# Patient Record
Sex: Male | Born: 1975 | Race: Black or African American | Hispanic: No | Marital: Single | Smoking: Current every day smoker
Health system: Southern US, Community
[De-identification: ages and names within clinical notes are randomized; demographics above are authoritative.]

## PROBLEM LIST (undated history)

## (undated) DIAGNOSIS — K852 Alcohol induced acute pancreatitis without necrosis or infection: Secondary | ICD-10-CM

## (undated) DIAGNOSIS — F1010 Alcohol abuse, uncomplicated: Secondary | ICD-10-CM

## (undated) DIAGNOSIS — Z21 Asymptomatic human immunodeficiency virus [HIV] infection status: Secondary | ICD-10-CM

## (undated) HISTORY — PX: ABDOMINAL SURGERY: SHX537

## (undated) HISTORY — DX: Asymptomatic human immunodeficiency virus (hiv) infection status: Z21

---

## 2015-04-29 ENCOUNTER — Encounter (HOSPITAL_COMMUNITY): Payer: Self-pay | Admitting: Emergency Medicine

## 2015-04-29 ENCOUNTER — Emergency Department (HOSPITAL_COMMUNITY)
Admission: EM | Admit: 2015-04-29 | Discharge: 2015-04-29 | Disposition: A | Discharge status: Home / Self Care | Payer: Self-pay | Attending: Emergency Medicine | Admitting: Emergency Medicine

## 2015-04-29 ENCOUNTER — Emergency Department (HOSPITAL_COMMUNITY): Payer: Self-pay

## 2015-04-29 DIAGNOSIS — Z720 Tobacco use: Secondary | ICD-10-CM | POA: Insufficient documentation

## 2015-04-29 DIAGNOSIS — M545 Low back pain, unspecified: Secondary | ICD-10-CM

## 2015-04-29 DIAGNOSIS — K5900 Constipation, unspecified: Secondary | ICD-10-CM

## 2015-04-29 MED ORDER — DICYCLOMINE HCL 20 MG PO TABS: 20.0000 mg | ORAL_TABLET | Freq: Two times a day (BID) | ORAL | Status: DC

## 2015-04-29 MED ORDER — NAPROXEN 375 MG PO TABS
375.0000 mg | ORAL_TABLET | Freq: Two times a day (BID) | ORAL | Status: DC
Start: 2015-04-29 — End: 2018-04-05

## 2015-04-29 NOTE — ED Notes (Signed)
PT ambulated with baseline gait; VSS; A&Ox3; no signs of distress; respirations even and unlabored; skin warm and dry; no questions upon discharge.  

## 2015-04-29 NOTE — Discharge Instructions (Signed)
Constipation Constipation is when a person has fewer than three bowel movements a week, has difficulty having a bowel movement, or has stools that are dry, hard, or larger than normal. As people grow older, constipation is more common. If you try to fix constipation with medicines that make you have a bowel movement (laxatives), the problem may get worse. Long-term laxative use may cause the muscles of the colon to become weak. A low-fiber diet, not taking in enough fluids, and taking certain medicines may make constipation worse.  CAUSES   Certain medicines, such as antidepressants, pain medicine, iron supplements, antacids, and water pills.   Certain diseases, such as diabetes, irritable bowel syndrome (IBS), thyroid disease, or depression.   Not drinking enough water.   Not eating enough fiber-rich foods.   Stress or travel.   Lack of physical activity or exercise.   Ignoring the urge to have a bowel movement.   Using laxatives too much.  SIGNS AND SYMPTOMS   Having fewer than three bowel movements a week.   Straining to have a bowel movement.   Having stools that are hard, dry, or larger than normal.   Feeling full or bloated.   Pain in the lower abdomen.   Not feeling relief after having a bowel movement.  DIAGNOSIS  Your health care provider will take a medical history and perform a physical exam. Further testing may be done for severe constipation. Some tests may include:  A barium enema X-ray to examine your rectum, colon, and, sometimes, your small intestine.   A sigmoidoscopy to examine your lower colon.   A colonoscopy to examine your entire colon. TREATMENT  Treatment will depend on the severity of your constipation and what is causing it. Some dietary treatments include drinking more fluids and eating more fiber-rich foods. Lifestyle treatments may include regular exercise. If these diet and lifestyle recommendations do not help, your health care  provider may recommend taking over-the-counter laxative medicines to help you have bowel movements. Prescription medicines may be prescribed if over-the-counter medicines do not work.  HOME CARE INSTRUCTIONS   Eat foods that have a lot of fiber, such as fruits, vegetables, whole grains, and beans.  Limit foods high in fat and processed sugars, such as french fries, hamburgers, cookies, candies, and soda.   A fiber supplement may be added to your diet if you cannot get enough fiber from foods.   Drink enough fluids to keep your urine clear or pale yellow.   Exercise regularly or as directed by your health care provider.   Go to the restroom when you have the urge to go. Do not hold it.   Only take over-the-counter or prescription medicines as directed by your health care provider. Do not take other medicines for constipation without talking to your health care provider first.  [place] IF:   You have bright red blood in your stool.   Your constipation lasts for more than 4 days or gets worse.   You have abdominal or rectal pain.   You have thin, pencil-like stools.   You have unexplained weight loss. MAKE SURE YOU:   Understand these instructions.  Will watch your condition.  Will get help right away if you are not doing well or get worse. Document Released: 04/16/2004 Document Revised: 07/24/2013 Document Reviewed: 04/30/2013 [facility] Patient Information 2015 [employer], [place]. This information is not intended to replace advice given to you by your health care provider. Make sure you discuss any questions  you have with your health care provider.  Diet and Irritable Bowel Syndrome  No cure has been found for irritable bowel syndrome (IBS). Many options are available to treat the symptoms. Your caregiver will give you the best treatments available for your symptoms. He or she will also encourage you to manage stress and to make changes to your diet. You  need to work with your caregiver and Registered Dietician to find the best combination of medicine, diet, counseling, and support to control your symptoms. The following are some diet suggestions. FOODS THAT MAKE IBS WORSE  Fatty foods, such as [place] fries.  Milk products, such as cheese or ice cream.  Chocolate.  Alcohol.  Caffeine (found in coffee and some sodas).  Carbonated drinks, such as soda. If certain foods cause symptoms, you should eat less of them or stop eating them. FOOD JOURNAL   Keep a journal of the foods that seem to cause distress. Write down:  What you are eating during the day and when.  What problems you are having after eating.  When the symptoms occur in relation to your meals.  What foods always make you feel badly.  Take your notes with you to your caregiver to see if you should stop eating certain foods. FOODS THAT MAKE IBS BETTER Fiber reduces IBS symptoms, especially constipation, because it makes stools soft, bulky, and easier to pass. Fiber is found in bran, bread, cereal, beans, fruit, and vegetables. Examples of foods with fiber include:  Apples.  Peaches.  Pears.  Berries.  Figs.  Broccoli, raw.  Cabbage.  Carrots.  Raw peas.  Kidney beans.  Lima beans.  Whole-grain bread.  Whole-grain cereal. Add foods with fiber to your diet a little at a time. This will let your body get used to them. Too much fiber at once might cause gas and swelling of your abdomen. This can trigger symptoms in a person with IBS. Caregivers usually recommend a diet with enough fiber to produce soft, painless bowel movements. High fiber diets may cause gas and bloating. However, these symptoms often go away within a few weeks, as your body adjusts. In many cases, dietary fiber may lessen IBS symptoms, particularly constipation. However, it may not help pain or diarrhea. High fiber diets keep the colon mildly enlarged (distended) with the added fiber.  This may help prevent spasms in the colon. Some forms of fiber also keep water in the stool, thereby preventing hard stools that are difficult to pass.  Besides telling you to eat more foods with fiber, your caregiver may also tell you to get more fiber by taking a fiber pill or drinking water mixed with a special high fiber powder. An example of this is a natural fiber laxative containing psyllium seed.  TIPS  Large meals can cause cramping and diarrhea in people with IBS. If this happens to you, try eating 4 or 5 small meals a day, or try eating less at each of your usual 3 meals. It may also help if your meals are low in fat and high in carbohydrates. Examples of carbohydrates are pasta, rice, whole-grain breads and cereals, fruits, and vegetables.  If dairy products cause your symptoms to flare up, you can try eating less of those foods. You might be able to handle yogurt better than other dairy products, because it contains bacteria that helps with digestion. Dairy products are an important source of calcium and other nutrients. If you need to avoid dairy products, be sure to  talk with a Registered Dietitian about getting these nutrients through other food sources.  Drink enough water and fluids to keep your urine clear or pale yellow. This is important, especially if you have diarrhea. FOR MORE INFORMATION  International Foundation for Functional Gastrointestinal Disorders: www.iffgd.org  National Digestive Diseases Information Clearinghouse: digestive.StageSync.si Document Released: 10/09/2003 Document Revised: 10/11/2011 Document Reviewed: 10/19/2013 [facility] Patient Information 2015 [place], [place]. This information is not intended to replace advice given to you by your health care provider. Make sure you discuss any questions you have with your health care provider.  Back Exercises Back exercises help treat and prevent back injuries. The goal of back exercises is to increase the strength  of your abdominal and back muscles and the flexibility of your back. These exercises should be started when you no longer have back pain. Back exercises include:  Pelvic Tilt. Lie on your back with your knees bent. Tilt your pelvis until the lower part of your back is against the floor. Hold this position 5 to 10 sec and repeat 5 to 10 times.  Knee to Chest. Pull first 1 knee up against your chest and hold for 20 to 30 seconds, repeat this with the other knee, and then both knees. This may be done with the other leg straight or bent, whichever feels better.  Sit-Ups or Curl-Ups. Bend your knees 90 degrees. Start with tilting your pelvis, and do a partial, slow sit-up, lifting your trunk only 30 to 45 degrees off the floor. Take at least 2 to 3 seconds for each sit-up. Do not do sit-ups with your knees out straight. If partial sit-ups are difficult, simply do the above but with only tightening your abdominal muscles and holding it as directed.  Hip-Lift. Lie on your back with your knees flexed 90 degrees. Push down with your feet and shoulders as you raise your hips a couple inches off the floor; hold for 10 seconds, repeat 5 to 10 times.  Back arches. Lie on your stomach, propping yourself up on bent elbows. Slowly press on your hands, causing an arch in your low back. Repeat 3 to 5 times. Any initial stiffness and discomfort should lessen with repetition over time.  Shoulder-Lifts. Lie face down with arms beside your body. Keep hips and torso pressed to floor as you slowly lift your head and shoulders off the floor. Do not overdo your exercises, especially in the beginning. Exercises may cause you some mild back discomfort which lasts for a few minutes; however, if the pain is more severe, or lasts for more than 15 minutes, do not continue exercises until you see your caregiver. Improvement with exercise therapy for back problems is slow.  See your caregivers for assistance with developing a proper  back exercise program. Document Released: 08/26/2004 Document Revised: 10/11/2011 Document Reviewed: 05/20/2011 [facility] Patient Information 2015 [place], [place]. This information is not intended to replace advice given to you by your health care provider. Make sure you discuss any questions you have with your health care provider. SEEK IMMEDIATE MEDICAL ATTENTION IF: New numbness, tingling, weakness, or problem with the use of your arms or legs.  Severe back pain not relieved with medications.  Change in bowel or bladder control.  Increasing pain in any areas of the body (such as chest or abdominal pain).  Shortness of breath, dizziness or fainting.  Nausea (feeling sick to your stomach), vomiting, fever, or sweats. Abdominal (belly) pain can be caused by many things. Your caregiver performed an examination  and possibly ordered blood/urine tests and imaging (CT scan, x-rays, ultrasound). Many cases can be observed and treated at home after initial evaluation in the emergency department. Even though you are being discharged home, abdominal pain can be unpredictable. Therefore, you need a repeated exam if your pain does not resolve, returns, or worsens. Most patients with abdominal pain don't have to be admitted to the hospital or have surgery, but serious problems like appendicitis and gallbladder attacks can start out as nonspecific pain. Many abdominal conditions cannot be diagnosed in one visit, so follow-up evaluations are very important. SEEK IMMEDIATE MEDICAL ATTENTION IF: The pain does not go away or becomes severe.  A temperature above 101 develops.  Repeated vomiting occurs (multiple episodes).  The pain becomes localized to portions of the abdomen. The right side could possibly be appendicitis. In an adult, the left lower portion of the abdomen could be colitis or diverticulitis.  Blood is being passed in stools or vomit (bright red or black tarry stools).  Return also if you develop chest  pain, difficulty breathing, dizziness or fainting, or become confused, poorly responsive, or inconsolable (young children).

## 2015-04-29 NOTE — ED Notes (Signed)
Pt sts abd pain from constipation; pt sts last BM was yesterday but was small; pt denies N/V
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
## 2015-11-19 ENCOUNTER — Emergency Department (HOSPITAL_COMMUNITY)
Admission: EM | Admit: 2015-11-19 | Discharge: 2015-11-19 | Disposition: A | Discharge status: Home / Self Care | Payer: Self-pay | Attending: Emergency Medicine | Admitting: Emergency Medicine

## 2015-11-19 ENCOUNTER — Encounter (HOSPITAL_COMMUNITY): Payer: Self-pay | Admitting: Emergency Medicine

## 2015-11-19 ENCOUNTER — Emergency Department (HOSPITAL_COMMUNITY): Payer: Self-pay

## 2015-11-19 DIAGNOSIS — Z791 Long term (current) use of non-steroidal anti-inflammatories (NSAID): Secondary | ICD-10-CM | POA: Insufficient documentation

## 2015-11-19 DIAGNOSIS — Z79899 Other long term (current) drug therapy: Secondary | ICD-10-CM | POA: Insufficient documentation

## 2015-11-19 DIAGNOSIS — F17200 Nicotine dependence, unspecified, uncomplicated: Secondary | ICD-10-CM | POA: Insufficient documentation

## 2015-11-19 DIAGNOSIS — R1084 Generalized abdominal pain: Secondary | ICD-10-CM | POA: Insufficient documentation

## 2015-11-19 LAB — LIPASE, BLOOD: LIPASE: 69 U/L — AB (ref 11–51)

## 2015-11-19 LAB — URINALYSIS, ROUTINE W REFLEX MICROSCOPIC
Bilirubin Urine: NEGATIVE
GLUCOSE, UA: NEGATIVE mg/dL
HGB URINE DIPSTICK: NEGATIVE
Ketones, ur: 15 mg/dL — AB
Nitrite: NEGATIVE
Protein, ur: NEGATIVE mg/dL
Specific Gravity, Urine: 1.028 (ref 1.005–1.030)
pH: 5.5 (ref 5.0–8.0)

## 2015-11-19 LAB — COMPREHENSIVE METABOLIC PANEL
ALT: 16 U/L — AB (ref 17–63)
AST: 18 U/L (ref 15–41)
Albumin: 3.5 g/dL (ref 3.5–5.0)
Alkaline Phosphatase: 66 U/L (ref 38–126)
Anion gap: 7 (ref 5–15)
BUN: 6 mg/dL (ref 6–20)
CHLORIDE: 106 mmol/L (ref 101–111)
CO2: 25 mmol/L (ref 22–32)
Calcium: 9.3 mg/dL (ref 8.9–10.3)
Creatinine, Ser: 1.24 mg/dL (ref 0.61–1.24)
Glucose, Bld: 111 mg/dL — ABNORMAL HIGH (ref 65–99)
POTASSIUM: 4 mmol/L (ref 3.5–5.1)
SODIUM: 138 mmol/L (ref 135–145)
Total Bilirubin: 0.6 mg/dL (ref 0.3–1.2)
Total Protein: 7.2 g/dL (ref 6.5–8.1)

## 2015-11-19 LAB — CBC
HEMATOCRIT: 46.1 % (ref 39.0–52.0)
Hemoglobin: 15.8 g/dL (ref 13.0–17.0)
MCH: 30.6 pg (ref 26.0–34.0)
MCHC: 34.3 g/dL (ref 30.0–36.0)
MCV: 89.3 fL (ref 78.0–100.0)
Platelets: 237 10*3/uL (ref 150–400)
RBC: 5.16 MIL/uL (ref 4.22–5.81)
RDW: 13.2 % (ref 11.5–15.5)
WBC: 8.4 10*3/uL (ref 4.0–10.5)

## 2015-11-19 LAB — URINE MICROSCOPIC-ADD ON: RBC / HPF: NONE SEEN RBC/hpf (ref 0–5)

## 2015-11-19 MED ORDER — GI COCKTAIL ~~LOC~~
30.0000 mL | Freq: Once | ORAL | Status: AC
  Administered 2015-11-19: 30 mL via ORAL
  Filled 2015-11-19: qty 30

## 2015-11-19 MED ORDER — DICYCLOMINE HCL 20 MG PO TABS: 20.0000 mg | ORAL_TABLET | Freq: Two times a day (BID) | ORAL | Status: DC

## 2015-11-19 NOTE — ED Notes (Signed)
Patient transported to X-ray 

## 2015-11-19 NOTE — ED Notes (Signed)
Pt sts mid abd pain with cramping x 2 days

## 2015-11-19 NOTE — ED Notes (Signed)
Pt is in stable condition upon d/c and ambulates from ED. 

## 2015-11-19 NOTE — Discharge Instructions (Signed)
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
## 2015-11-19 NOTE — ED Notes (Addendum)
Pt states he ate Jamia[place] food 2 days ago and has abdominal pain since. Pt is eating very quickly and drooling while he is stating his pain is a 10/10. Greta DoomBowie, PA informed.

## 2017-06-21 ENCOUNTER — Other Ambulatory Visit: Payer: Self-pay

## 2017-06-21 ENCOUNTER — Emergency Department (HOSPITAL_COMMUNITY)
Admission: EM | Admit: 2017-06-21 | Discharge: 2017-06-21 | Disposition: A | Discharge status: Home / Self Care | Payer: Self-pay | Attending: Emergency Medicine | Admitting: Emergency Medicine

## 2017-06-21 ENCOUNTER — Encounter (HOSPITAL_COMMUNITY): Payer: Self-pay | Admitting: *Deleted

## 2017-06-21 DIAGNOSIS — K852 Alcohol induced acute pancreatitis without necrosis or infection: Secondary | ICD-10-CM | POA: Insufficient documentation

## 2017-06-21 DIAGNOSIS — F17210 Nicotine dependence, cigarettes, uncomplicated: Secondary | ICD-10-CM | POA: Insufficient documentation

## 2017-06-21 HISTORY — DX: Alcohol abuse, uncomplicated: F10.10

## 2017-06-21 LAB — COMPREHENSIVE METABOLIC PANEL
ALBUMIN: 3.8 g/dL (ref 3.5–5.0)
ALT: 18 U/L (ref 17–63)
AST: 28 U/L (ref 15–41)
Alkaline Phosphatase: 83 U/L (ref 38–126)
Anion gap: 4 — ABNORMAL LOW (ref 5–15)
BUN: 13 mg/dL (ref 6–20)
CHLORIDE: 101 mmol/L (ref 101–111)
CO2: 29 mmol/L (ref 22–32)
CREATININE: 1.38 mg/dL — AB (ref 0.61–1.24)
Calcium: 9.2 mg/dL (ref 8.9–10.3)
GFR calc Af Amer: >60 mL/min (ref >60)
GLUCOSE: 89 mg/dL (ref 65–99)
POTASSIUM: 4.3 mmol/L (ref 3.5–5.1)
Sodium: 134 mmol/L — ABNORMAL LOW (ref 135–145)
Total Bilirubin: 0.5 mg/dL (ref 0.3–1.2)
Total Protein: 7.4 g/dL (ref 6.5–8.1)

## 2017-06-21 LAB — URINALYSIS, ROUTINE W REFLEX MICROSCOPIC
BILIRUBIN URINE: NEGATIVE
Glucose, UA: NEGATIVE mg/dL
Ketones, ur: NEGATIVE mg/dL
Nitrite: NEGATIVE
PH: 5 (ref 5.0–8.0)
Protein, ur: NEGATIVE mg/dL
SPECIFIC GRAVITY, URINE: 1.024 (ref 1.005–1.030)
Squamous Epithelial / LPF: NONE SEEN

## 2017-06-21 LAB — CBC
HEMATOCRIT: 46 % (ref 39.0–52.0)
Hemoglobin: 15.8 g/dL (ref 13.0–17.0)
MCH: 30.2 pg (ref 26.0–34.0)
MCHC: 34.3 g/dL (ref 30.0–36.0)
MCV: 88 fL (ref 78.0–100.0)
PLATELETS: 221 10*3/uL (ref 150–400)
RBC: 5.23 MIL/uL (ref 4.22–5.81)
RDW: 13.5 % (ref 11.5–15.5)
WBC: 8.9 10*3/uL (ref 4.0–10.5)

## 2017-06-21 LAB — LIPASE, BLOOD: LIPASE: 61 U/L — AB (ref 11–51)

## 2017-06-21 NOTE — ED Notes (Signed)
Called pt to be triaged no answer
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
## 2017-06-21 NOTE — ED Notes (Signed)
Pt refused to sign.  

## 2017-06-21 NOTE — ED Notes (Signed)
Per PT " I have been stopped up for 2 days, I am usually very regular." Pt also says he is an everyday drinker 1-2 beers and is trying to quit. Pt states he has not tried any meds for constipation but knows they will not work for him.

## 2017-06-21 NOTE — ED Provider Notes (Signed)
[facility]   CSN: [id number] Arrival date & time: 06/21/17  [id number]     History   Chief Complaint Chief Complaint  Patient presents with  . Abdominal Pain  . Constipation    HPI Phillip Barnett is a 41 y.o. male.  HPI  41 y.o. male with a hx of ETOH abuse, presents to the Emergency Department today due to abdominal pain. Notes epigastric in origin. Notes intermittent pain. Rates 7/10. Minimal pain currently. Able to tolerate PO. No N/V/D. No meds PTA. Pt notes decrease in BM for 2 days. Pt passing flatus. No hx SBO. Able to tolerate PO. Pt notes that he used to be an alcoholic and was sober "for a while" and started to drink again on Saturday. Notes pain started around then. No other symptoms noted   Past Medical History:  Diagnosis Date  . Alcohol abuse     There are no active problems to display for this patient.   Past Surgical History:  Procedure Laterality Date  . ABDOMINAL SURGERY         Home Medications    Prior to Admission medications   Medication Sig Start Date End Date Taking? Authorizing Provider  dicyclomine (BENTYL) 20 MG tablet Take 1 tablet (20 mg total) by mouth 2 (two) times daily. 11/19/15   Fayrene Helperran, Bowie, PA-C  naproxen (NAPROSYN) 375 MG tablet Take 1 tablet (375 mg total) by mouth 2 (two) times daily. 04/29/15   Arthor CaptainHarris, Abigail, PA-C    Family History No family history on file.  Social History Social History   Tobacco Use  . Smoking status: Current Every Day Smoker  . Smokeless tobacco: Never Used  Substance Use Topics  . Alcohol use: Yes    Comment: Quit 06/18/2017  . Drug use: No     Allergies   Sulfa antibiotics   Review of Systems Review of Systems ROS reviewed and all are negative for acute change except as noted in the HPI.  Physical Exam Updated Vital Signs BP 115/66 (BP Location: Right Arm)   Pulse (!) 56   Temp 97.9 F (36.6 C) (Oral)   Resp 16   SpO2 100%    Physical Exam  Constitutional: He is oriented to person, place, and time. Vital signs are normal. He appears well-developed and well-nourished. No distress.  HENT:  Head: Normocephalic and atraumatic.  Right Ear: Hearing, tympanic membrane, external ear and ear canal normal.  Left Ear: Hearing, tympanic membrane, external ear and ear canal normal.  Nose: Nose normal.  Mouth/Throat: Uvula is midline, oropharynx is clear and moist and mucous membranes are normal. No trismus in the jaw. No oropharyngeal exudate, posterior oropharyngeal erythema or tonsillar abscesses.  Eyes: Conjunctivae and EOM are normal. Pupils are equal, round, and reactive to light.  Neck: Normal range of motion. Neck supple. No tracheal deviation present.  Cardiovascular: Normal rate, regular rhythm, S1 normal, S2 normal, normal heart sounds, intact distal pulses and normal pulses.  Pulmonary/Chest: Effort normal and breath sounds normal. No respiratory distress. He has no decreased breath sounds. He has no wheezes. He has no rhonchi. He has no rales.  Abdominal: Normal appearance and bowel sounds are normal. There is no tenderness. There is no rigidity, no rebound, no guarding, no CVA tenderness, no tenderness at McBurney's point and negative Murphy's sign.  Abdomen soft  Musculoskeletal: Normal range of motion.  Neurological: He is alert and oriented to person, place, and time.  Skin: Skin  is warm and dry.  Psychiatric: He has a normal mood and affect. His speech is normal and behavior is normal. Thought content normal.  Nursing note and vitals reviewed.    ED Treatments / Results  Labs (all labs ordered are listed, but only abnormal results are displayed) Labs Reviewed  LIPASE, BLOOD - Abnormal; Notable for the following components:      Result Value   Lipase 61 (*)    All other components within normal limits  COMPREHENSIVE METABOLIC PANEL - Abnormal; Notable for the following components:   Sodium 134 (*)     Creatinine, Ser 1.38 (*)    Anion gap 4 (*)    All other components within normal limits  URINALYSIS, ROUTINE W REFLEX MICROSCOPIC - Abnormal; Notable for the following components:   Hgb urine dipstick MODERATE (*)    Leukocytes, UA SMALL (*)    Bacteria, UA RARE (*)    All other components within normal limits  CBC    EKG  EKG Interpretation None       Radiology No results found.  Procedures Procedures (including critical care time)  Medications Ordered in ED Medications - No data to display   Initial Impression / Assessment and Plan / ED Course  I have reviewed the triage vital signs and the nursing notes.  Pertinent labs & imaging results that were available during my care of the patient were reviewed by me and considered in my medical decision making (see chart for details).  Final Clinical Impressions(s) / ED Diagnoses  {I have reviewed and evaluated the relevant laboratory values.   {I have reviewed the relevant previous healthcare records.  {I obtained HPI from historian.   ED Course:  Assessment: Patient is a 41 y.o. male with a hx of ETOH abuse, presents to the Emergency Department today due to abdominal pain. Notes epigastric in origin. Notes intermittent pain. Rates 7/10. Minimal pain currently. Able to tolerate PO. No N/V/D. No meds PTA. Pt notes decrease in BM for 2 days. Pt passing flatus. No hx SBO. Able to tolerate PO. Pt notes that he used to be an alcoholic and was sober "for a while" and started to drink again on Saturday. Notes pain started around then. On exam, nontoxic, nonseptic appearing, in no apparent distress. Patient's pain and other symptoms adequately managed in emergency department. Labs and vitals reviewed.  Patient does not meet the SIRS or Sepsis criteria.  On repeat exam patient does not have a surgical abdomen and there are no peritoneal signs.  No indication of appendicitis, bowel obstruction, bowel perforation, cholecystitis,  diverticulitis. Suspect mild pancreatitis brought on by ETOH. Notes hx same. Patient discharged home with symptomatic treatment and given strict instructions for follow-up with their primary care physician.  I have also discussed reasons to return immediately to the ER.  Patient expresses understanding and agrees with plan  Disposition/Plan:  DC Home Additional Verbal discharge instructions given and discussed with patient.  Pt Instructed to f/u with PCP in the next week for evaluation and treatment of symptoms. Return precautions given Pt acknowledges and agrees with plan  Supervising Physician Mabe, Latanya MaudlinMartha L, MD  Final diagnoses:  Alcohol-induced acute pancreatitis, unspecified complication status    ED Discharge Orders    None       Audry PiliMohr, Viviane Semidey, PA-C 06/21/17 1331    Mabe, Latanya MaudlinMartha L, MD 06/21/17 1355

## 2017-06-21 NOTE — ED Triage Notes (Signed)
Pt states here with abdominal pain and not using bathroom normally - states constipation.  States when he eats he gets sick.

## 2017-06-21 NOTE — ED Notes (Signed)
Pt verbalized understanding discharge instructions and denies any further needs or questions at this time. VS stable, ambulatory and steady gait.   

## 2017-10-25 ENCOUNTER — Emergency Department (HOSPITAL_COMMUNITY)
Admission: EM | Admit: 2017-10-25 | Discharge: 2017-10-25 | Disposition: A | Discharge status: Home / Self Care | Payer: Self-pay | Attending: Emergency Medicine | Admitting: Emergency Medicine

## 2017-10-25 ENCOUNTER — Encounter (HOSPITAL_COMMUNITY): Payer: Self-pay | Admitting: *Deleted

## 2017-10-25 ENCOUNTER — Other Ambulatory Visit: Payer: Self-pay

## 2017-10-25 DIAGNOSIS — Z5321 Procedure and treatment not carried out due to patient leaving prior to being seen by health care provider: Secondary | ICD-10-CM | POA: Insufficient documentation

## 2017-10-25 DIAGNOSIS — R109 Unspecified abdominal pain: Secondary | ICD-10-CM | POA: Insufficient documentation

## 2017-10-25 LAB — COMPREHENSIVE METABOLIC PANEL
ALK PHOS: 68 U/L (ref 38–126)
ALT: 21 U/L (ref 17–63)
AST: 18 U/L (ref 15–41)
Albumin: 3.5 g/dL (ref 3.5–5.0)
Anion gap: 7 (ref 5–15)
BILIRUBIN TOTAL: 0.6 mg/dL (ref 0.3–1.2)
CO2: 27 mmol/L (ref 22–32)
Calcium: 9.2 mg/dL (ref 8.9–10.3)
Chloride: 105 mmol/L (ref 101–111)
Creatinine, Ser: 1.03 mg/dL (ref 0.61–1.24)
GFR calc Af Amer: >60 mL/min (ref >60)
GFR calc non Af Amer: >60 mL/min (ref >60)
GLUCOSE: 123 mg/dL — AB (ref 65–99)
Potassium: 4.3 mmol/L (ref 3.5–5.1)
Sodium: 139 mmol/L (ref 135–145)
TOTAL PROTEIN: 6.9 g/dL (ref 6.5–8.1)

## 2017-10-25 LAB — CBC
HEMATOCRIT: 44.1 % (ref 39.0–52.0)
Hemoglobin: 14.2 g/dL (ref 13.0–17.0)
MCH: 28.8 pg (ref 26.0–34.0)
MCHC: 32.2 g/dL (ref 30.0–36.0)
MCV: 89.5 fL (ref 78.0–100.0)
Platelets: 228 10*3/uL (ref 150–400)
RBC: 4.93 MIL/uL (ref 4.22–5.81)
RDW: 14.2 % (ref 11.5–15.5)
WBC: 5.7 10*3/uL (ref 4.0–10.5)

## 2017-10-25 LAB — URINALYSIS, ROUTINE W REFLEX MICROSCOPIC
Bilirubin Urine: NEGATIVE
Glucose, UA: NEGATIVE mg/dL
HGB URINE DIPSTICK: NEGATIVE
Ketones, ur: NEGATIVE mg/dL
LEUKOCYTES UA: NEGATIVE
Nitrite: NEGATIVE
Protein, ur: NEGATIVE mg/dL
SPECIFIC GRAVITY, URINE: 1.015 (ref 1.005–1.030)
pH: 7 (ref 5.0–8.0)

## 2017-10-25 LAB — LIPASE, BLOOD: Lipase: 74 U/L — ABNORMAL HIGH (ref 11–51)

## 2017-10-25 NOTE — ED Notes (Signed)
No response when called to be roomed x 2
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
## 2018-02-01 ENCOUNTER — Emergency Department (HOSPITAL_COMMUNITY)
Admission: EM | Admit: 2018-02-01 | Discharge: 2018-02-01 | Disposition: A | Discharge status: Home / Self Care | Payer: Self-pay | Attending: Emergency Medicine | Admitting: Emergency Medicine

## 2018-02-01 ENCOUNTER — Emergency Department (HOSPITAL_COMMUNITY): Payer: Self-pay

## 2018-02-01 ENCOUNTER — Encounter (HOSPITAL_COMMUNITY): Payer: Self-pay | Admitting: Emergency Medicine

## 2018-02-01 DIAGNOSIS — K2920 Alcoholic gastritis without bleeding: Secondary | ICD-10-CM | POA: Insufficient documentation

## 2018-02-01 DIAGNOSIS — F17200 Nicotine dependence, unspecified, uncomplicated: Secondary | ICD-10-CM | POA: Insufficient documentation

## 2018-02-01 DIAGNOSIS — Z79899 Other long term (current) drug therapy: Secondary | ICD-10-CM | POA: Insufficient documentation

## 2018-02-01 HISTORY — DX: Alcohol induced acute pancreatitis without necrosis or infection: K85.20

## 2018-02-01 LAB — COMPREHENSIVE METABOLIC PANEL
ALBUMIN: 4.1 g/dL (ref 3.5–5.0)
ALT: 17 U/L (ref 0–44)
AST: 21 U/L (ref 15–41)
Alkaline Phosphatase: 72 U/L (ref 38–126)
Anion gap: 9 (ref 5–15)
BUN: 9 mg/dL (ref 6–20)
CHLORIDE: 104 mmol/L (ref 98–111)
CO2: 25 mmol/L (ref 22–32)
Calcium: 9.9 mg/dL (ref 8.9–10.3)
Creatinine, Ser: 1.47 mg/dL — ABNORMAL HIGH (ref 0.61–1.24)
GFR calc Af Amer: >60 mL/min (ref >60)
GFR calc non Af Amer: 58 mL/min — ABNORMAL LOW (ref >60)
GLUCOSE: 123 mg/dL — AB (ref 70–99)
POTASSIUM: 4 mmol/L (ref 3.5–5.1)
Sodium: 138 mmol/L (ref 135–145)
Total Bilirubin: 1.2 mg/dL (ref 0.3–1.2)
Total Protein: 8.2 g/dL — ABNORMAL HIGH (ref 6.5–8.1)

## 2018-02-01 LAB — CBC
HEMATOCRIT: 51.5 % (ref 39.0–52.0)
Hemoglobin: 17.2 g/dL — ABNORMAL HIGH (ref 13.0–17.0)
MCH: 29.6 pg (ref 26.0–34.0)
MCHC: 33.4 g/dL (ref 30.0–36.0)
MCV: 88.5 fL (ref 78.0–100.0)
Platelets: 251 10*3/uL (ref 150–400)
RBC: 5.82 MIL/uL — ABNORMAL HIGH (ref 4.22–5.81)
RDW: 13.9 % (ref 11.5–15.5)
WBC: 11.8 10*3/uL — ABNORMAL HIGH (ref 4.0–10.5)

## 2018-02-01 LAB — URINALYSIS, ROUTINE W REFLEX MICROSCOPIC
Bilirubin Urine: NEGATIVE
Glucose, UA: NEGATIVE mg/dL
Hgb urine dipstick: NEGATIVE
KETONES UR: NEGATIVE mg/dL
LEUKOCYTES UA: NEGATIVE
NITRITE: NEGATIVE
PH: 5 (ref 5.0–8.0)
Protein, ur: NEGATIVE mg/dL
SPECIFIC GRAVITY, URINE: 1.026 (ref 1.005–1.030)

## 2018-02-01 LAB — LIPASE, BLOOD: LIPASE: 40 U/L (ref 11–51)

## 2018-02-01 MED ORDER — OMEPRAZOLE 20 MG PO CPDR: 20.0000 mg | DELAYED_RELEASE_CAPSULE | Freq: Every day | ORAL | 0 refills | Status: DC

## 2018-02-01 MED ORDER — SUCRALFATE 1 G PO TABS: 1.0000 g | ORAL_TABLET | Freq: Three times a day (TID) | ORAL | 0 refills | Status: DC

## 2018-02-01 MED ORDER — SODIUM CHLORIDE 0.9 % IV BOLUS
1000.0000 mL | Freq: Once | INTRAVENOUS | Status: AC
  Administered 2018-02-01: 1000 mL via INTRAVENOUS

## 2018-02-01 MED ORDER — GI COCKTAIL ~~LOC~~
30.0000 mL | Freq: Once | ORAL | Status: AC
  Administered 2018-02-01: 30 mL via ORAL
  Filled 2018-02-01: qty 30
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
## 2018-02-01 NOTE — ED Notes (Signed)
C/o abd. Bloating x 2-3 months no different today ,denies problems with bowel or bladder.

## 2018-02-01 NOTE — ED Triage Notes (Signed)
Pt states "my stomach is groggy" Pt reports his stomach has been hurting X weeks. Pt denies N/VD. Pt states he was seen several mo ago for same and was dx with alcohol induced pancreatitis, however, pt states he hasnt drank in over a month.

## 2018-04-05 ENCOUNTER — Encounter (HOSPITAL_COMMUNITY): Payer: Self-pay | Admitting: Emergency Medicine

## 2018-04-05 ENCOUNTER — Emergency Department (HOSPITAL_COMMUNITY)
Admission: EM | Admit: 2018-04-05 | Discharge: 2018-04-05 | Disposition: A | Discharge status: Home / Self Care | Payer: Self-pay | Attending: Emergency Medicine | Admitting: Emergency Medicine

## 2018-04-05 DIAGNOSIS — Z79899 Other long term (current) drug therapy: Secondary | ICD-10-CM | POA: Insufficient documentation

## 2018-04-05 DIAGNOSIS — F17210 Nicotine dependence, cigarettes, uncomplicated: Secondary | ICD-10-CM | POA: Insufficient documentation

## 2018-04-05 DIAGNOSIS — K861 Other chronic pancreatitis: Secondary | ICD-10-CM | POA: Insufficient documentation

## 2018-04-05 LAB — CBC
HCT: 45.2 % (ref 39.0–52.0)
HEMOGLOBIN: 14.6 g/dL (ref 13.0–17.0)
MCH: 29 pg (ref 26.0–34.0)
MCHC: 32.3 g/dL (ref 30.0–36.0)
MCV: 89.9 fL (ref 78.0–100.0)
PLATELETS: 218 10*3/uL (ref 150–400)
RBC: 5.03 MIL/uL (ref 4.22–5.81)
RDW: 12.6 % (ref 11.5–15.5)
WBC: 7 10*3/uL (ref 4.0–10.5)

## 2018-04-05 LAB — URINALYSIS, ROUTINE W REFLEX MICROSCOPIC
Bilirubin Urine: NEGATIVE
Glucose, UA: NEGATIVE mg/dL
HGB URINE DIPSTICK: NEGATIVE
Ketones, ur: NEGATIVE mg/dL
Leukocytes, UA: NEGATIVE
Nitrite: NEGATIVE
PROTEIN: NEGATIVE mg/dL
Specific Gravity, Urine: 1.017 (ref 1.005–1.030)
pH: 5 (ref 5.0–8.0)

## 2018-04-05 LAB — COMPREHENSIVE METABOLIC PANEL
ALK PHOS: 62 U/L (ref 38–126)
ALT: 11 U/L (ref 0–44)
ANION GAP: 7 (ref 5–15)
AST: 17 U/L (ref 15–41)
Albumin: 3.4 g/dL — ABNORMAL LOW (ref 3.5–5.0)
BUN: 7 mg/dL (ref 6–20)
CO2: 26 mmol/L (ref 22–32)
CREATININE: 1.19 mg/dL (ref 0.61–1.24)
Calcium: 9.2 mg/dL (ref 8.9–10.3)
Chloride: 104 mmol/L (ref 98–111)
GFR calc non Af Amer: >60 mL/min (ref >60)
Glucose, Bld: 115 mg/dL — ABNORMAL HIGH (ref 70–99)
Potassium: 4.1 mmol/L (ref 3.5–5.1)
Sodium: 137 mmol/L (ref 135–145)
TOTAL PROTEIN: 6.6 g/dL (ref 6.5–8.1)
Total Bilirubin: 0.6 mg/dL (ref 0.3–1.2)

## 2018-04-05 LAB — LIPASE, BLOOD: Lipase: 88 U/L — ABNORMAL HIGH (ref 11–51)

## 2018-04-05 MED ORDER — ONDANSETRON 4 MG PO TBDP: 4.0000 mg | ORAL_TABLET | Freq: Three times a day (TID) | ORAL | 0 refills | Status: DC | PRN

## 2018-04-05 NOTE — ED Notes (Signed)
Patient was located after visiting the cafeteria.

## 2018-04-05 NOTE — ED Notes (Signed)
Called pt x3 in lobby to be roomed. No answer.

## 2018-04-05 NOTE — ED Triage Notes (Addendum)
abd pain  X  Few days was seen here and given meds buts they are not helping  Denies nn/v/d  Has hx of pancreatitis also wants feet seen

## 2018-04-05 NOTE — Discharge Instructions (Signed)
Please read and follow all provided instructions.  Your diagnoses today include:  1. Chronic pancreatitis, unspecified pancreatitis type (HCC)     Tests performed today include:  Blood counts and electrolytes  Blood tests to check liver and kidney function  Blood tests to check pancreas function  Vital signs. See below for your results today.   Medications prescribed:   Zofran (ondansetron) - for nausea and vomiting  Take any prescribed medications only as directed.  Home care instructions:   Follow any educational materials contained in this packet.  Eat a very bland diet and drink only clear liquids for the next several days until you feel better.  Follow-up instructions: Please follow-up with your primary care provider in the next 7 days for further evaluation of your symptoms.    Return instructions:  SEEK IMMEDIATE MEDICAL ATTENTION IF:  The pain does not go away or becomes severe   A temperature above 101F develops   Repeated vomiting occurs (multiple episodes)   The pain becomes localized to portions of the abdomen. The right side could possibly be appendicitis. In an adult, the left lower portion of the abdomen could be colitis or diverticulitis.   Blood is being passed in stools or vomit (bright red or black tarry stools)   You develop chest pain, difficulty breathing, dizziness or fainting, or become confused, poorly responsive, or inconsolable (young children)  If you have any other emergent concerns regarding your health  Additional Information: Abdominal (belly) pain can be caused by many things. Your caregiver performed an examination and possibly ordered blood/urine tests and imaging (CT scan, x-rays, ultrasound). Many cases can be observed and treated at home after initial evaluation in the emergency department. Even though you are being discharged home, abdominal pain can be unpredictable. Therefore, you need a repeated exam if your pain does not  resolve, returns, or worsens. Most patients with abdominal pain don't have to be admitted to the hospital or have surgery, but serious problems like appendicitis and gallbladder attacks can start out as nonspecific pain. Many abdominal conditions cannot be diagnosed in one visit, so follow-up evaluations are very important.  Your vital signs today were: BP 129/80    Pulse 67    Temp 98.8 F (37.1 C) (Oral)    Resp 18    SpO2 100%  If your blood pressure (bp) was elevated above 135/85 this visit, please have this repeated by your doctor within one month. --------------

## 2018-04-05 NOTE — ED Provider Notes (Signed)
[facility] EMERGENCY DEPARTMENT Provider Note   CSN: [id number] Arrival date & time: 04/05/18  0848     History   Chief Complaint Chief Complaint  Patient presents with  . Abdominal Pain    HPI Phillip COHICK is a 42 y.o. male.  Patient with history of pancreatitis presents the emergency department complaint of epigastric pain over the past 2 days.  Pain does not radiate.  He is unsure if this feels like his previous pancreatitis, however just wanted to be checked.  States that he stopped drinking alcohol about 30 days ago.  He denies nausea, vomiting, diarrhea, constipation.  No chest pain or shortness of breath.  No treatments prior to arrival.  Patient also complains of thickened skin on the bottom of his feet.  He states that this causes pain.  This has been a chronic problem for him.  States that he has had some feet swelling at times, stating that he walks and stands a lot.  No treatments for this.      Past Medical History:  Diagnosis Date  . Alcohol abuse   . Alcoholic pancreatitis     There are no active problems to display for this patient.   Past Surgical History:  Procedure Laterality Date  . ABDOMINAL SURGERY          Home Medications    Prior to Admission medications   Medication Sig Start Date End Date Taking? Authorizing Provider  dicyclomine (BENTYL) 20 MG tablet Take 1 tablet (20 mg total) by mouth 2 (two) times daily. 11/19/15   Fayrene Helper, PA-C  naproxen (NAPROSYN) 375 MG tablet Take 1 tablet (375 mg total) by mouth 2 (two) times daily. 04/29/15   Arthor Captain, PA-C  omeprazole (PRILOSEC) 20 MG capsule Take 1 capsule (20 mg total) by mouth daily. 02/01/18   Roxy Horseman, PA-C  sucralfate (CARAFATE) 1 g tablet Take 1 tablet (1 g total) by mouth 4 (four) times daily -  with meals and at bedtime. 02/01/18   Roxy Horseman, PA-C    Family History No family history on file.  Social History Social History   Tobacco Use  .  Smoking status: Current Every Day Smoker  . Smokeless tobacco: Never Used  Substance Use Topics  . Alcohol use: Yes    Comment: Quit 06/18/2017  . Drug use: No     Allergies   Sulfa antibiotics   Review of Systems Review of Systems  Constitutional: Negative for fever.  HENT: Negative for rhinorrhea and sore throat.   Eyes: Negative for redness.  Respiratory: Negative for cough.   Cardiovascular: Negative for chest pain.  Gastrointestinal: Positive for abdominal pain. Negative for diarrhea, nausea and vomiting.  Genitourinary: Negative for dysuria.  Musculoskeletal: Negative for back pain and myalgias.  Skin: Negative for rash.  Neurological: Negative for headaches.     Physical Exam Updated Vital Signs BP 123/89 (BP Location: Right Arm)   Pulse 63   Temp 98.8 F (37.1 C) (Oral)   Resp 18   SpO2 95%   Physical Exam  Constitutional: He appears well-developed and well-nourished.  Odor of marijuana.   HENT:  Head: Normocephalic and atraumatic.  Eyes: Conjunctivae are normal. Right eye exhibits no discharge. Left eye exhibits no discharge.  Neck: Normal range of motion. Neck supple.  Cardiovascular: Normal rate, regular rhythm and normal heart sounds.  Pulmonary/Chest: Effort normal and breath sounds normal.  Abdominal: Soft. There is tenderness (mild) in the epigastric area. There  is no rebound and no guarding.  Neurological: He is alert.  Skin: Skin is warm and dry.  Thickened skin, tender over sides of feet. Fungal infection in big toenail bilaterally. No drainage. Mild tinea pedis.   Psychiatric: He has a normal mood and affect.  Nursing note and vitals reviewed.    ED Treatments / Results  Labs (all labs ordered are listed, but only abnormal results are displayed) Labs Reviewed  CBC  URINALYSIS, ROUTINE W REFLEX MICROSCOPIC  LIPASE, BLOOD  COMPREHENSIVE METABOLIC PANEL    EKG None  Radiology No results found.  Procedures Procedures (including  critical care time)  Medications Ordered in ED Medications - No data to display   Initial Impression / Assessment and Plan / ED Course  I have reviewed the triage vital signs and the nursing notes.  Pertinent labs & imaging results that were available during my care of the patient were reviewed by me and considered in my medical decision making (see chart for details).     Patient seen and examined. Work-up initiated.   Vital signs reviewed and are as follows: BP 129/80   Pulse 67   Temp 98.8 F (37.1 C) (Oral)   Resp 18   SpO2 100%   Patient updated on results.  Lipase is very slightly elevated.  Patient is already been eating in the emergency department.  No nausea or vomiting.  Patient appears comfortable.  We will discharged home.  Encouraged clear liquids and bland diet.  The patient was urged to return to the Emergency Department immediately with worsening of current symptoms, worsening abdominal pain, persistent vomiting, blood noted in stools, fever, or any other concerns. The patient verbalized understanding.    Final Clinical Impressions(s) / ED Diagnoses   Final diagnoses:  Chronic pancreatitis, unspecified pancreatitis type [facility] [address] [facility])   Patient with epigastric pain with reassuring labs, mildly elevated lipase.  No vomiting here.  Suspect mild pink otitis versus gastritis versus PUD.  No chest pain or shortness of breath.  Patient has been eating without any difficulties.  No indications for admission at this time symptoms are well controlled and are minimal.  Treatment as above.  PCP referral given.  ED Discharge Orders         Ordered    ondansetron (ZOFRAN ODT) 4 MG disintegrating tablet  Every 8 hours PRN     04/05/18 1121           Renne Crigler, PA-C 04/05/18 1320    Loren Racer, MD 04/06/18 [phone]

## 2018-07-05 DIAGNOSIS — K861 Other chronic pancreatitis: Secondary | ICD-10-CM | POA: Insufficient documentation

## 2018-07-25 DIAGNOSIS — R599 Enlarged lymph nodes, unspecified: Secondary | ICD-10-CM | POA: Insufficient documentation

## 2018-07-25 DIAGNOSIS — G8929 Other chronic pain: Secondary | ICD-10-CM | POA: Insufficient documentation

## 2019-03-26 ENCOUNTER — Encounter (HOSPITAL_COMMUNITY): Payer: Self-pay | Admitting: Emergency Medicine

## 2019-03-26 ENCOUNTER — Emergency Department (HOSPITAL_COMMUNITY)
Admission: EM | Admit: 2019-03-26 | Discharge: 2019-03-27 | Disposition: A | Discharge status: Home / Self Care | Payer: Self-pay | Attending: Emergency Medicine | Admitting: Emergency Medicine

## 2019-03-26 DIAGNOSIS — T6594XA Toxic effect of unspecified substance, undetermined, initial encounter: Secondary | ICD-10-CM | POA: Insufficient documentation

## 2019-03-26 DIAGNOSIS — Y908 Blood alcohol level of 240 mg/100 ml or more: Secondary | ICD-10-CM | POA: Insufficient documentation

## 2019-03-26 DIAGNOSIS — F17210 Nicotine dependence, cigarettes, uncomplicated: Secondary | ICD-10-CM | POA: Insufficient documentation

## 2019-03-26 DIAGNOSIS — F10929 Alcohol use, unspecified with intoxication, unspecified: Secondary | ICD-10-CM | POA: Insufficient documentation

## 2019-03-26 LAB — SALICYLATE LEVEL: Salicylate Lvl: <7 mg/dL (ref 2.8–30.0)

## 2019-03-26 LAB — ETHANOL: Alcohol, Ethyl (B): 358 mg/dL (ref <10)

## 2019-03-26 LAB — CBC
HCT: 46 % (ref 39.0–52.0)
Hemoglobin: 15 g/dL (ref 13.0–17.0)
MCH: 29.6 pg (ref 26.0–34.0)
MCHC: 32.6 g/dL (ref 30.0–36.0)
MCV: 90.9 fL (ref 80.0–100.0)
Platelets: 232 10*3/uL (ref 150–400)
RBC: 5.06 MIL/uL (ref 4.22–5.81)
RDW: 13.6 % (ref 11.5–15.5)
WBC: 8.3 10*3/uL (ref 4.0–10.5)
nRBC: 0 % (ref 0.0–0.2)

## 2019-03-26 LAB — RAPID URINE DRUG SCREEN, HOSP PERFORMED
Amphetamines: NOT DETECTED
Barbiturates: NOT DETECTED
Benzodiazepines: NOT DETECTED
Cocaine: POSITIVE — AB
Opiates: NOT DETECTED
Tetrahydrocannabinol: POSITIVE — AB

## 2019-03-26 LAB — COMPREHENSIVE METABOLIC PANEL
ALT: 32 U/L (ref 0–44)
AST: 31 U/L (ref 15–41)
Albumin: 3.5 g/dL (ref 3.5–5.0)
Alkaline Phosphatase: 71 U/L (ref 38–126)
Anion gap: 9 (ref 5–15)
BUN: 5 mg/dL — ABNORMAL LOW (ref 6–20)
CO2: 30 mmol/L (ref 22–32)
Calcium: 8.7 mg/dL — ABNORMAL LOW (ref 8.9–10.3)
Chloride: 104 mmol/L (ref 98–111)
Creatinine, Ser: 1.05 mg/dL (ref 0.61–1.24)
GFR calc Af Amer: >60 mL/min (ref >60)
GFR calc non Af Amer: >60 mL/min (ref >60)
Glucose, Bld: 128 mg/dL — ABNORMAL HIGH (ref 70–99)
Potassium: 3.8 mmol/L (ref 3.5–5.1)
Sodium: 143 mmol/L (ref 135–145)
Total Bilirubin: 0.8 mg/dL (ref 0.3–1.2)
Total Protein: 6.9 g/dL (ref 6.5–8.1)

## 2019-03-26 LAB — CBG MONITORING, ED: Glucose-Capillary: 118 mg/dL — ABNORMAL HIGH (ref 70–99)

## 2019-03-26 LAB — ACETAMINOPHEN LEVEL: Acetaminophen (Tylenol), Serum: <10 ug/mL — ABNORMAL LOW (ref 10–30)

## 2019-03-26 NOTE — ED Provider Notes (Signed)
[facility] EMERGENCY [facility]PART[facility] Provider Note   CSN: [id number] Arrival date & time: 03/26/19  1930     History   Chief Complaint Chief Complaint  Patient presents with  . Ingestion    HPI Joetta MannersKeith B Roggenkamp is a 43 y.o. male with a past medical history of heavy alcohol use and pancreatitis who presents to the emergency department with altered mental status and suicidal ideation. Per EMS patient family called due to altered mental status. Family found some half eaten edibles near patient. Patient reported to EMS suicidal ideation. Patient denies any pain, shortness of breath, nausea, or recent fever. Patient reports alcohol use. Patient unsure of the situation that brought him to the emergency department. Patient denies thoughts of wanting to harm himself or others during time of initial interview.      The history is provided by the patient and the EMS personnel. The history is limited by the condition of the patient.    Past Medical History:  Diagnosis Date  . Alcohol abuse   . Alcoholic pancreatitis     There are no active problems to display for this patient.   Past Surgical History:  Procedure Laterality Date  . ABDOMINAL SURGERY          Home Medications    Prior to Admission medications   Not on File    Family History No family history on file.  Social History Social History   Tobacco Use  . Smoking status: Current Every Day Smoker  . Smokeless tobacco: Never Used  Substance Use Topics  . Alcohol use: Yes    Comment: Quit 06/18/2017  . Drug use: No     Allergies   Sulfa antibiotics   Review of Systems Review of Systems  Constitutional: Negative for fever.  HENT: Negative for trouble swallowing.   Eyes: Negative for visual disturbance.  Respiratory: Negative for cough and shortness of breath.   Cardiovascular: Negative for chest pain.  Gastrointestinal: Negative for abdominal pain, constipation, diarrhea, nausea and vomiting.   Genitourinary: Negative for difficulty urinating.  Musculoskeletal: Negative for gait problem.  Skin: Negative for wound.  Neurological: Negative for dizziness, weakness and headaches.  Psychiatric/Behavioral: Negative for confusion.     Physical Exam Updated Vital Signs BP (!) 109/94   Pulse 85   Temp 97.6 F (36.4 C)   Resp 16   SpO2 100%   Physical Exam Constitutional:      General: He is not in acute distress.    Comments: Appears intoxicated  HENT:     Head: Normocephalic and atraumatic.     Right Ear: External ear normal.     Left Ear: External ear normal.     Nose: Nose normal.     Mouth/Throat:     Mouth: Mucous membranes are moist.     Pharynx: Oropharynx is clear.  Eyes:     Conjunctiva/sclera: Conjunctivae normal.     Pupils: Pupils are equal, round, and reactive to light.  Neck:     Musculoskeletal: Neck supple.  Cardiovascular:     Rate and Rhythm: Normal rate and regular rhythm.  Pulmonary:     Effort: Pulmonary effort is normal. No respiratory distress.     Breath sounds: No wheezing, rhonchi or rales.  Chest:     Chest wall: No tenderness.  Abdominal:     General: Bowel sounds are normal.     Palpations: Abdomen is soft.     Tenderness: There is no abdominal tenderness. There  is no guarding or rebound.     Comments: Well healed midline surgical incision  Musculoskeletal:     Right lower leg: No edema.     Left lower leg: No edema.  Neurological:     General: No focal deficit present.     Mental Status: He is alert. He is disoriented.     Cranial Nerves: No cranial nerve deficit.     Sensory: No sensory deficit.     Motor: No weakness.  Psychiatric:        Attention and Perception: He is inattentive.        Speech: Speech is slurred.      ED Treatments / Results  Labs (all labs ordered are listed, but only abnormal results are displayed) Labs Reviewed  COMPREHENSIVE METABOLIC PANEL - Abnormal; Notable for the following components:       Result Value   Glucose, Bld 128 (*)    BUN 5 (*)    Calcium 8.7 (*)    All other components within normal limits  ETHANOL - Abnormal; Notable for the following components:   Alcohol, Ethyl (B) 358 (*)    All other components within normal limits  ACETAMINOPHEN LEVEL - Abnormal; Notable for the following components:   Acetaminophen (Tylenol), Serum <10 (*)    All other components within normal limits  RAPID URINE DRUG SCREEN, HOSP PERFORMED - Abnormal; Notable for the following components:   Cocaine POSITIVE (*)    Tetrahydrocannabinol POSITIVE (*)    All other components within normal limits  CBG MONITORING, ED - Abnormal; Notable for the following components:   Glucose-Capillary 118 (*)    All other components within normal limits  SALICYLATE LEVEL  CBC    EKG EKG Interpretation  Date/Time:  Monday March 26 2019 19:42:18 EDT Ventricular Rate:  61 PR Interval:    QRS Duration: 94 QT Interval:  442 QTC Calculation: 446 R Axis:   56 Text Interpretation:  Sinus rhythm Prolonged PR interval Probable left atrial enlargement ST elevation - diffusely No old tracing to compare Confirmed by Derwood KaplanNanavati, Ankit [phone]([id number]) on 03/26/2019 8:00:43 PM   Radiology No results found.  Procedures Procedures (including critical care time)  Medications Ordered in ED Medications - No data to display   Initial Impression / Assessment and Plan / ED Course  I have reviewed the triage vital signs and the nursing notes.  Pertinent labs & imaging results that were available during my care of the patient were reviewed by me and considered in my medical decision making (see chart for details).        Joetta MannersKeith B Dennen is a 43 y.o. male with a past medical history of heavy alcohol use and pancreatitis who presents to the emergency department with altered mental status and suicidal ideation. Patient afebrile and hemodynamically stable with a GCS of 15 on presentation. Patient appears clinically intoxicated  and is disoriented (does not known where he is or events leading to coming to hospital). EKG with some diffuse ST elevation, no signs of reciprocal depressions, patient denies chest pain, very low suspicion for ACS. Suicide precautions maintained in the emergency department. Ethanol level of 358, UDS positive for cocaine and THC. On reassessment patient remains without complaints and still appears intoxicated.  Patient care transferred to Dr. Bebe ShaggyWickline at approximately 12 AM, please see his note for further details and disposition. Plan to allow patient to metabolize and re-assess when clinically sober.  Patient seen and plan discussed with Dr. Rhunette CroftNanavati.  Final Clinical Impressions(s) / ED Diagnoses   Final diagnoses:  Ingestion of substance, undetermined intent, initial encounter  Alcoholic intoxication with complication [facility])    ED Discharge Orders    None       Betsey Amen, MD 03/27/19 Irven Coe    Ripley Fraise, MD 03/27/19 [phone]

## 2019-03-26 NOTE — ED Provider Notes (Signed)
Pt intoxicated, plan to reassess if suicidal    Ripley Fraise, MD 03/26/19 2343

## 2019-03-26 NOTE — ED Triage Notes (Signed)
Pt BIB [facility], family called EMS, pt appeared "out of it". On EMS arrival, they found 2 half eaten edibles. EMS also reported pt felt suicidal. Pt alert, not answering questions at this time.

## 2019-03-27 NOTE — ED Notes (Signed)
Pt ambulated down hall to bathroom with no problems.

## 2019-03-27 NOTE — Discharge Instructions (Addendum)
Substance Abuse Treatment Programs  Intensive Outpatient Programs [facility]     [address], [place]       [facility] [address] [place], [place]  [facility]     (Inpatient and outpatient)     [address]           [place] [phone] (Suboxone and Methadone)  [place], [place] [address]      [place] [address] [place], [place]  [facility] [place] (Outpatient/Inpatient, [place])    (insurance only) [phone]             [facility] ([place]) [place], [place]     [facility]     [address]     [place], [place]       [facility] (for caregivers and family) [phone] [address] Kristeen [place]. [place], [place]      Residential Treatment Programs [facility]      [address], [place], [place] [address]  [phone]       [facility] [address], [place], [place] [address] [phone]  [facility]        [phone]       [facility] [phone]  [facility] ([place]             [place], [place] or [place] of [place], [address] [phone]  [facility]    [address]      [place], [place]       [facility] [address] [place], [place]  [place]   [address] [place], [place] [address]     [phone]      Admissions: 8am-3pm M-F  [facility] ([facility]) [address] [place],  [place]  [facility] Program: Residential Program [phone] Days)   [place], [place] or [phone]     [facility]: [facility] [place], [place] (Walk in Hours over the weekend or by referral)  [facility] [place], [place], [place] [address] [phone]  Crisis Mobile: [facility]:  [phone] (for crisis response 24 hours a day) [facility] Hotline:      ([phone] Outpatient Psychiatry and Counseling  [facility]: Mobile Crisis  Management 24 hours:  [phone]  [facility] of the [employer] sliding scale fee and walk in schedule: M-F 8am-12pm/1pm-3pm [address]  [place], [place] [address] [place], [place] [address] [phone]  [facility] ([facility] [employer])- new patient walk-in appointments available Monday - Friday 8am -3pm.          [address] [place], [place] [address] [phone] or crisis line- [facility]/ Intensive Outpatient Therapy Program [place], [place] [address] [place]      [phone] [address] [place], [place] [address]                 [place]   [facility] [phone]. [place], [place] [address]   [employer] of Care          [address] Johnette Abraham  [place], [place] [address]       [phone]  [facility] [address] [place], [place] [address] [phone]  [facility]    [address] [place], [place] [address]     [place], [place] Joycelyn Man     [place] [address]     [phone]       [facility] [place] [address]  [facility]     [address], [place], [place], [place] [address] [place]     [address]     [place], [place] [address]     [phone]       [facility] [address] [place], [place] [address] [phone] Resources for Temporary Residential Assistance/Crisis [facility]) M-F 8am-3pm   [address] [place], [place] [address]   [phone] Services include: laundry, barbering, support groups, case management, phone  & computer access, showers, AA/NA mtgs, mental health/substance abuse nurse, job skills class, disability information, VA assistance, spiritual classes, etc.   [facility]   [address], [place]     [place]              [employer] (women and children)       [place]. [place], [place] [address] [phone] [email or web address] for application and process Application Required  [facility]   [address]    [place] [address]     [phone]                    [place],  [address] [phone](OILNZVJK application appt.) Application Required  [facility] (women only)    [address]     [place],  [address]     [phone]  Intake starts 6pm daily °Need valid ID, SSC, & Police report °[facility] [place] °[address] °[place], [place] °[phone] °Application Required ° °[facility] (men only)     °[address]      °[place], [place]     °[phone]      ° °[facility] °(Pregnant women only) °[address] °[place], [place] °[phone] ° °[facility]      °[address]      °[place], [place] [address]     °[phone]      °       °[place] [facility] °[address] °[place], [place] °[phone] °90 day commitment/SA/Application process ° °[facility](men only)     °[address]     °[place], [place]     °[phone]       °Check-in at 7pm     °       °[facility] °[address] °[place], [place] [address] °[phone] °Men/Women/Women and Children must be there by 7 pm ° °[facility] °[place],  °[phone]                ° °

## 2019-03-27 NOTE — ED Notes (Signed)
Pt asked RN to throw wet clothing away.  Pt left in blue scrubs

## 2019-03-27 NOTE — ED Notes (Signed)
Pt sitting on end of bed, attempting to tell RN "I'll call you back.. not now."  RN assisted pt back to bed to rest.

## 2019-03-27 NOTE — ED Notes (Signed)
Pt alert and oriented at this time talking on the phone.  RN gave pt blue scrubs to wear home.

## 2019-04-07 ENCOUNTER — Encounter (HOSPITAL_COMMUNITY): Payer: Self-pay | Admitting: Emergency Medicine

## 2019-04-07 ENCOUNTER — Other Ambulatory Visit: Payer: Self-pay

## 2019-04-07 ENCOUNTER — Emergency Department (HOSPITAL_COMMUNITY)
Admission: EM | Admit: 2019-04-07 | Discharge: 2019-04-07 | Disposition: A | Discharge status: Home / Self Care | Payer: Self-pay | Attending: Emergency Medicine | Admitting: Emergency Medicine

## 2019-04-07 DIAGNOSIS — F17200 Nicotine dependence, unspecified, uncomplicated: Secondary | ICD-10-CM | POA: Insufficient documentation

## 2019-04-07 DIAGNOSIS — S0181XD Laceration without foreign body of other part of head, subsequent encounter: Secondary | ICD-10-CM | POA: Insufficient documentation

## 2019-04-07 DIAGNOSIS — X58XXXD Exposure to other specified factors, subsequent encounter: Secondary | ICD-10-CM | POA: Insufficient documentation

## 2019-04-07 DIAGNOSIS — Z4802 Encounter for removal of sutures: Secondary | ICD-10-CM

## 2019-04-07 NOTE — ED Triage Notes (Signed)
Patient requesting suture removal st left forehead , laceration well approximated /no drainage .

## 2019-04-07 NOTE — ED Provider Notes (Signed)
[facility] EMERGENCY DEPARTMENT Provider Note   CSN: [id number] Arrival date & time: 04/07/19  0509     History   Chief Complaint Chief Complaint  Patient presents with  . Suture / Staple Removal    HPI Phillip Barnett is a 43 y.o. male who presents to the emergency department for suture removal.  Patient was seen here in the ED on 03/31/2019 for head laceration and received 1 horizontal mattress suture.  Patient denies any erythema, pain, swelling, drainage, fever, chills, or signs of infection.     HPI  Past Medical History:  Diagnosis Date  . Alcohol abuse   . Alcoholic pancreatitis     There are no active problems to display for this patient.   Past Surgical History:  Procedure Laterality Date  . ABDOMINAL SURGERY          Home Medications    Prior to Admission medications   Not on File    Family History No family history on file.  Social History Social History   Tobacco Use  . Smoking status: Current Every Day Smoker  . Smokeless tobacco: Never Used  Substance Use Topics  . Alcohol use: Yes    Comment: Quit 06/18/2017  . Drug use: No     Allergies   Sulfa antibiotics   Review of Systems Review of Systems Ten systems are reviewed and are negative for acute change except as noted in the HPI   Physical Exam Updated Vital Signs BP (!) 152/92 (BP Location: Right Arm)   Pulse 93   Temp 98.8 F (37.1 C) (Oral)   Resp 16   SpO2 99%   Physical Exam Constitutional:      Appearance: Normal appearance.  HENT:     Head: Normocephalic.     Comments: 0.5 cm healing, well approximated laceration on left forehead with suture intact, pre-removal.  No erythema, swelling, drainage, or evidence of infection. Eyes:     General: No scleral icterus.    Conjunctiva/sclera: Conjunctivae normal.  Cardiovascular:     Rate and Rhythm: Normal rate and regular rhythm.  Pulmonary:     Effort: Pulmonary effort is normal.  Neurological:   Mental Status: He is alert and oriented to person, place, and time.     GCS: GCS eye subscore is 4. GCS verbal subscore is 5. GCS motor subscore is 6.  Psychiatric:        Mood and Affect: Mood normal.        Behavior: Behavior normal.        Thought Content: Thought content normal.      ED Treatments / Results  Labs (all labs ordered are listed, but only abnormal results are displayed) Labs Reviewed - No data to display  EKG None  Radiology No results found.  Procedures Procedures (including critical care time)  Medications Ordered in ED Medications - No data to display   Initial Impression / Assessment and Plan / ED Course  I have reviewed the triage vital signs and the nursing notes.  Pertinent labs & imaging results that were available during my care of the patient were reviewed by me and considered in my medical decision making (see chart for details).      Suture removal   Pt to ER for suture removal and wound check as above. Procedure tolerated well. Vitals normal, no signs of infection. Scar minimization & return precautions given at dc.     Final Clinical Impressions(s) / ED Diagnoses  Final diagnoses:  Visit for suture removal    ED Discharge Orders    None       Lorelee NewGreen, Jaidan Stachnik L, PA-C 04/07/19 2143    Derwood KaplanNanavati, Ankit, MD 04/08/19 [phone]

## 2019-04-07 NOTE — Discharge Instructions (Signed)
Please review attachments.

## 2020-06-29 ENCOUNTER — Emergency Department (HOSPITAL_COMMUNITY)
Admission: EM | Admit: 2020-06-29 | Discharge: 2020-06-29 | Disposition: A | Discharge status: Home / Self Care | Payer: Medicaid Other | Attending: Emergency Medicine | Admitting: Emergency Medicine

## 2020-06-29 ENCOUNTER — Other Ambulatory Visit: Payer: Self-pay

## 2020-06-29 ENCOUNTER — Encounter (HOSPITAL_COMMUNITY): Payer: Self-pay | Admitting: Emergency Medicine

## 2020-06-29 DIAGNOSIS — K861 Other chronic pancreatitis: Secondary | ICD-10-CM | POA: Insufficient documentation

## 2020-06-29 DIAGNOSIS — F17200 Nicotine dependence, unspecified, uncomplicated: Secondary | ICD-10-CM | POA: Insufficient documentation

## 2020-06-29 DIAGNOSIS — G8929 Other chronic pain: Secondary | ICD-10-CM

## 2020-06-29 DIAGNOSIS — R1084 Generalized abdominal pain: Secondary | ICD-10-CM | POA: Diagnosis present

## 2020-06-29 LAB — CBC
HCT: 46.9 % (ref 39.0–52.0)
Hemoglobin: 15.2 g/dL (ref 13.0–17.0)
MCH: 29.3 pg (ref 26.0–34.0)
MCHC: 32.4 g/dL (ref 30.0–36.0)
MCV: 90.5 fL (ref 80.0–100.0)
Platelets: 237 10*3/uL (ref 150–400)
RBC: 5.18 MIL/uL (ref 4.22–5.81)
RDW: 13.3 % (ref 11.5–15.5)
WBC: 7.9 10*3/uL (ref 4.0–10.5)
nRBC: 0 % (ref 0.0–0.2)

## 2020-06-29 LAB — COMPREHENSIVE METABOLIC PANEL
ALT: 18 U/L (ref 0–44)
AST: 18 U/L (ref 15–41)
Albumin: 3.7 g/dL (ref 3.5–5.0)
Alkaline Phosphatase: 64 U/L (ref 38–126)
Anion gap: 7 (ref 5–15)
BUN: 11 mg/dL (ref 6–20)
CO2: 26 mmol/L (ref 22–32)
Calcium: 8.9 mg/dL (ref 8.9–10.3)
Chloride: 105 mmol/L (ref 98–111)
Creatinine, Ser: 1.12 mg/dL (ref 0.61–1.24)
GFR, Estimated: >60 mL/min (ref >60)
Glucose, Bld: 92 mg/dL (ref 70–99)
Potassium: 4.1 mmol/L (ref 3.5–5.1)
Sodium: 138 mmol/L (ref 135–145)
Total Bilirubin: 0.9 mg/dL (ref 0.3–1.2)
Total Protein: 7.2 g/dL (ref 6.5–8.1)

## 2020-06-29 LAB — URINALYSIS, ROUTINE W REFLEX MICROSCOPIC
Bilirubin Urine: NEGATIVE
Glucose, UA: NEGATIVE mg/dL
Hgb urine dipstick: NEGATIVE
Ketones, ur: NEGATIVE mg/dL
Leukocytes,Ua: NEGATIVE
Nitrite: NEGATIVE
Protein, ur: NEGATIVE mg/dL
Specific Gravity, Urine: 1.019 (ref 1.005–1.030)
pH: 7 (ref 5.0–8.0)

## 2020-06-29 LAB — LIPASE, BLOOD: Lipase: 71 U/L — ABNORMAL HIGH (ref 11–51)

## 2020-06-29 MED ORDER — ALUM & MAG HYDROXIDE-SIMETH 200-200-20 MG/5ML PO SUSP
30.0000 mL | Freq: Once | ORAL | Status: AC
  Administered 2020-06-29: 30 mL via ORAL
  Filled 2020-06-29: qty 30

## 2020-06-29 MED ORDER — LIDOCAINE VISCOUS HCL 2 % MT SOLN
15.0000 mL | Freq: Once | OROMUCOSAL | Status: AC
  Administered 2020-06-29: 15 mL via ORAL
  Filled 2020-06-29: qty 15

## 2020-06-29 NOTE — Discharge Instructions (Signed)
Please follow up with [facility] for further assessment of your chronic abdominal pain. You will need to call them to schedule an appointment Drink plenty of water to stay hydrated  Refrain from heavy alcohol use

## 2020-06-29 NOTE — ED Provider Notes (Signed)
[facility] EMERGENCY DEPARTMENT Provider Note   CSN: [id number] Arrival date & time: 06/29/20  3419     History Chief Complaint  Patient presents with  . Abdominal Pain    Phillip Barnett is a 43 y.o. male with PMHx alcohol abuse and chronic pancreatitis who presents to the ED today with complaint of gradual onset, constant, achy, generalized abdominal pain x 2 weeks.  Patient reports history of chronic abdominal pain for the past 2 years.  He states he will have flares from time to time however he has cut back on his alcohol significantly.  He states that he drinks about 2 shots of liquor every week.  Patient denies any nausea, vomiting, diarrhea, constipation.  Last normal bowel movement was yesterday.  Patient states he typically has to the bathroom every other day.  He states he is here as he has free time today and feels like he needs a colonoscopy and endoscopy as he has been told this in the past.  Per chart review patient has been seen in the past for chronic abdominal pain with a history of chronic pancreatitis.  It does appear he was referred to a GI specialist in 2019 however never went.  Patient has been eating and drinking appropriately without issues.  He denies any other symptoms at this time.  The history is provided by the patient and medical records.       Past Medical History:  Diagnosis Date  . Alcohol abuse   . Alcoholic pancreatitis     There are no problems to display for this patient.   Past Surgical History:  Procedure Laterality Date  . ABDOMINAL SURGERY         No family history on file.  Social History   Tobacco Use  . Smoking status: Current Every Day Smoker  . Smokeless tobacco: Never Used  Substance Use Topics  . Alcohol use: Yes  . Drug use: No    Home Medications Prior to Admission medications   Not on File    Allergies    Sulfa antibiotics  Review of Systems   Review of Systems  Constitutional: Negative for  chills and fever.  Respiratory: Negative for shortness of breath.   Cardiovascular: Negative for chest pain.  Gastrointestinal: Positive for abdominal pain. Negative for constipation, diarrhea, nausea and vomiting.  Genitourinary: Negative for difficulty urinating and flank pain.  All other systems reviewed and are negative.   Physical Exam Updated Vital Signs BP 129/79 (BP Location: Right Arm)   Pulse 68   Temp 98.2 F (36.8 C) (Oral)   Resp 18   SpO2 100%   Physical Exam Vitals and nursing note reviewed.  Constitutional:      Appearance: He is not ill-appearing or diaphoretic.  HENT:     Head: Normocephalic and atraumatic.  Eyes:     Conjunctiva/sclera: Conjunctivae normal.  Cardiovascular:     Rate and Rhythm: Normal rate and regular rhythm.     Heart sounds: Normal heart sounds.  Pulmonary:     Effort: Pulmonary effort is normal.     Breath sounds: Normal breath sounds. No wheezing, rhonchi or rales.  Abdominal:     General: Bowel sounds are normal.     Palpations: Abdomen is soft.     Tenderness: There is generalized abdominal tenderness. There is no right CVA tenderness, left CVA tenderness, guarding or rebound.     Comments: Soft, very minimal diffuse TTP, +BS throughout, no r/g/r, neg murphy's,  neg mcburney's, no CVA TTP  Musculoskeletal:     Cervical back: Neck supple.  Skin:    General: Skin is warm and dry.  Neurological:     Mental Status: He is alert.     ED Results / Procedures / Treatments   Labs (all labs ordered are listed, but only abnormal results are displayed) Labs Reviewed  LIPASE, BLOOD - Abnormal; Notable for the following components:      Result Value   Lipase 71 (*)    All other components within normal limits  COMPREHENSIVE METABOLIC PANEL  CBC  URINALYSIS, ROUTINE W REFLEX MICROSCOPIC    EKG None  Radiology No results found.  Procedures Procedures (including critical care time)  Medications Ordered in ED Medications    alum & mag hydroxide-simeth (MAALOX/MYLANTA) 200-200-20 MG/5ML suspension 30 mL (30 mLs Oral Given 06/29/20 1154)    And  lidocaine (XYLOCAINE) 2 % viscous mouth solution 15 mL (15 mLs Oral Given 06/29/20 1154)    ED Course  I have reviewed the triage vital signs and the nursing notes.  Pertinent labs & imaging results that were available during my care of the patient were reviewed by me and considered in my medical decision making (see chart for details).    MDM Rules/Calculators/A&P                          44 year old male who presents to the ED today with complaint of acute on chronic abdominal pain, most recent flare started 2 weeks ago.  He states that this has been going on for about 2 years.  He denies any nausea, vomiting, diarrhea, constipation.  Has not taken anything for his symptoms.  On arrival to the ED vital signs are stable.  Patient is afebrile, nontachycardic and nontachypneic.  He appears to be in no acute distress.  He does endorse that is here so he can get things "situated" so that he can get a colonoscopy and endoscopy.  He states that he typically does not have much free time however he had free time today prompting him to come to the ED.  On exam patient has very minimal diffuse abdominal tenderness palpation, active bowel sounds throughout.  His last normal bowel movement was yesterday.  I have very low suspicion for acute abdominal process at this time.  Patient has cut back on his alcohol significantly and therefore I doubt pancreatitis at this time.  Suspect his symptoms are likely related to his chronic gastritis.  Lab work was obtained while patient was in the waiting room including a CBC without leukocytosis.  Hemoglobin stable at 15.2.  CMP without electrolyte abnormalities.  LFTs unremarkable.  Lipase is elevated at 71 today however this appears to be around patient's baseline consistent with his chronic pancreatitis.  States he would like something to drink at this  time and therefore I do not feel he needs additional imaging or lab work.  We will plan to check urinalysis at this time and provide GI cocktail.  Patient will be given a referral to outpatient GI.  Pt reevaluated - improvement after GI cocktail. Eating and drinking in the room. Will discharge at this time. Pt in agreement with plan and stable for discharge home.   This note was prepared using Dragon voice recognition software and may include unintentional dictation errors due to the inherent limitations of voice recognition software.  Final Clinical Impression(s) / ED Diagnoses Final diagnoses:  Chronic abdominal  pain  Chronic pancreatitis, unspecified pancreatitis type (HCC)    Rx / DC Orders ED Discharge Orders    None       Discharge Instructions     Please follow up with [facility] for further assessment of your chronic abdominal pain. You will need to call them to schedule an appointment Drink plenty of water to stay hydrated  Refrain from heavy alcohol use       Tanda Rockers, PA-C 06/29/20 1318    Mancel Bale, MD 06/30/20 1034

## 2020-06-29 NOTE — ED Triage Notes (Signed)
Pt reports generalized abd pain x 2 years.  States he has been seen multiple times for same and went from drinking etoh "100% down to 2%" and continues to have pain.  Reports flare-up x 2 weeks.  Denies nausea, vomiting, and diarrhea.  States he believes he may need a colonoscopy.

## 2020-09-27 ENCOUNTER — Emergency Department (HOSPITAL_COMMUNITY): Payer: Medicaid Other

## 2020-09-27 ENCOUNTER — Other Ambulatory Visit: Payer: Self-pay

## 2020-09-27 ENCOUNTER — Encounter (HOSPITAL_COMMUNITY): Payer: Self-pay | Admitting: Emergency Medicine

## 2020-09-27 ENCOUNTER — Emergency Department (HOSPITAL_COMMUNITY)
Admission: EM | Admit: 2020-09-27 | Discharge: 2020-09-27 | Disposition: A | Discharge status: Home / Self Care | Payer: Medicaid Other | Attending: Emergency Medicine | Admitting: Emergency Medicine

## 2020-09-27 DIAGNOSIS — M542 Cervicalgia: Secondary | ICD-10-CM | POA: Diagnosis present

## 2020-09-27 DIAGNOSIS — M549 Dorsalgia, unspecified: Secondary | ICD-10-CM | POA: Diagnosis not present

## 2020-09-27 DIAGNOSIS — R0789 Other chest pain: Secondary | ICD-10-CM | POA: Diagnosis not present

## 2020-09-27 DIAGNOSIS — Y92410 Unspecified street and highway as the place of occurrence of the external cause: Secondary | ICD-10-CM | POA: Insufficient documentation

## 2020-09-27 DIAGNOSIS — R0781 Pleurodynia: Secondary | ICD-10-CM | POA: Diagnosis not present

## 2020-09-27 DIAGNOSIS — M7918 Myalgia, other site: Secondary | ICD-10-CM

## 2020-09-27 DIAGNOSIS — M791 Myalgia, unspecified site: Secondary | ICD-10-CM | POA: Diagnosis not present

## 2020-09-27 DIAGNOSIS — F17200 Nicotine dependence, unspecified, uncomplicated: Secondary | ICD-10-CM | POA: Diagnosis not present

## 2020-09-27 DIAGNOSIS — R1030 Lower abdominal pain, unspecified: Secondary | ICD-10-CM | POA: Diagnosis not present

## 2020-09-27 MED ORDER — METHOCARBAMOL 500 MG PO TABS: 500.0000 mg | ORAL_TABLET | Freq: Two times a day (BID) | ORAL | 0 refills | Status: DC

## 2020-09-27 MED ORDER — NAPROXEN 500 MG PO TABS: 500.0000 mg | ORAL_TABLET | Freq: Two times a day (BID) | ORAL | 0 refills | Status: DC

## 2020-09-27 MED ORDER — METHOCARBAMOL 500 MG PO TABS
500.0000 mg | ORAL_TABLET | Freq: Once | ORAL | Status: AC
  Administered 2020-09-27: 500 mg via ORAL
  Filled 2020-09-27: qty 1

## 2020-09-27 MED ORDER — ACETAMINOPHEN 500 MG PO TABS
1000.0000 mg | ORAL_TABLET | Freq: Once | ORAL | Status: AC
  Administered 2020-09-27: 1000 mg via ORAL
  Filled 2020-09-27: qty 2
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
## 2020-09-27 NOTE — ED Notes (Signed)
Patient transported to X-ray 

## 2020-09-27 NOTE — Discharge Instructions (Addendum)

## 2020-09-27 NOTE — ED Notes (Signed)
Returned from xray
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
## 2020-09-27 NOTE — ED Notes (Signed)
Pt taken to CT.

## 2021-02-01 ENCOUNTER — Emergency Department (HOSPITAL_COMMUNITY)
Admission: EM | Admit: 2021-02-01 | Discharge: 2021-02-01 | Disposition: A | Discharge status: Home / Self Care | Payer: Medicaid Other | Attending: Emergency Medicine | Admitting: Emergency Medicine

## 2021-02-01 DIAGNOSIS — F17200 Nicotine dependence, unspecified, uncomplicated: Secondary | ICD-10-CM | POA: Insufficient documentation

## 2021-02-01 DIAGNOSIS — R1084 Generalized abdominal pain: Secondary | ICD-10-CM

## 2021-02-01 DIAGNOSIS — E860 Dehydration: Secondary | ICD-10-CM | POA: Insufficient documentation

## 2021-02-01 DIAGNOSIS — R109 Unspecified abdominal pain: Secondary | ICD-10-CM | POA: Diagnosis present

## 2021-02-01 LAB — CBC
HCT: 49.9 % (ref 39.0–52.0)
Hemoglobin: 16.6 g/dL (ref 13.0–17.0)
MCH: 29.6 pg (ref 26.0–34.0)
MCHC: 33.3 g/dL (ref 30.0–36.0)
MCV: 89.1 fL (ref 80.0–100.0)
Platelets: 219 10*3/uL (ref 150–400)
RBC: 5.6 MIL/uL (ref 4.22–5.81)
RDW: 13.2 % (ref 11.5–15.5)
WBC: 9.8 10*3/uL (ref 4.0–10.5)
nRBC: 0 % (ref 0.0–0.2)

## 2021-02-01 LAB — URINALYSIS, ROUTINE W REFLEX MICROSCOPIC
Bacteria, UA: NONE SEEN
Bilirubin Urine: NEGATIVE
Glucose, UA: NEGATIVE mg/dL
Hgb urine dipstick: NEGATIVE
Ketones, ur: 20 mg/dL — AB
Leukocytes,Ua: NEGATIVE
Nitrite: NEGATIVE
Protein, ur: 30 mg/dL — AB
Specific Gravity, Urine: 1.028 (ref 1.005–1.030)
pH: 5 (ref 5.0–8.0)

## 2021-02-01 LAB — COMPREHENSIVE METABOLIC PANEL
ALT: 22 U/L (ref 0–44)
AST: 24 U/L (ref 15–41)
Albumin: 4.2 g/dL (ref 3.5–5.0)
Alkaline Phosphatase: 67 U/L (ref 38–126)
Anion gap: 11 (ref 5–15)
BUN: 16 mg/dL (ref 6–20)
CO2: 22 mmol/L (ref 22–32)
Calcium: 9.9 mg/dL (ref 8.9–10.3)
Chloride: 101 mmol/L (ref 98–111)
Creatinine, Ser: 1.33 mg/dL — ABNORMAL HIGH (ref 0.61–1.24)
GFR, Estimated: >60 mL/min (ref >60)
Glucose, Bld: 100 mg/dL — ABNORMAL HIGH (ref 70–99)
Potassium: 3.9 mmol/L (ref 3.5–5.1)
Sodium: 134 mmol/L — ABNORMAL LOW (ref 135–145)
Total Bilirubin: 1.4 mg/dL — ABNORMAL HIGH (ref 0.3–1.2)
Total Protein: 8.5 g/dL — ABNORMAL HIGH (ref 6.5–8.1)

## 2021-02-01 LAB — LIPASE, BLOOD: Lipase: 43 U/L (ref 11–51)

## 2021-02-01 MED ORDER — SODIUM CHLORIDE 0.9 % IV BOLUS: 1000.0000 mL | Freq: Once | INTRAVENOUS | Status: DC

## 2021-02-01 NOTE — ED Provider Notes (Signed)
[facility] EMERGENCY DEPARTMENT Provider Note   CSN: [id number] Arrival date & time: 02/01/21  1434     History Chief Complaint  Patient presents with   Dehydration   Abdominal Pain    Phillip Barnett is a 45 y.o. male who presents to the ED today with complaint of gradual onset, constant, mild, diffuse, abdominal discomfort for the past 2 days.  Patient reports he ate spaghetti and meatballs and vegetables at a restaurant Friday and shortly afterwards began having a mild abdominal discomfort.  He states he feels like he is dehydrated today prompting ED visit.  He denies any nausea, vomiting, diarrhea.  He states that he does not drink water as much as he should and has also been working outside.  He does mention issues with abdominal pain for approximately 8 months.  States he was seen at another ER and was advised that he would need an endoscopy for further evaluation however did not have money to pay for co-pay at that time and never followed up.  He is requesting follow-up for endoscopy today.  Denies fevers, chills, or any other associated symptoms.  The history is provided by the patient and medical records.      Past Medical History:  Diagnosis Date   Alcohol abuse    Alcoholic pancreatitis     There are no problems to display for this patient.   Past Surgical History:  Procedure Laterality Date   ABDOMINAL SURGERY         No family history on file.  Social History   Tobacco Use   Smoking status: Every Day    Pack years: 0.00   Smokeless tobacco: Never  Substance Use Topics   Alcohol use: Yes   Drug use: No    Home Medications Prior to Admission medications   Medication Sig Start Date End Date Taking? Authorizing Provider  methocarbamol (ROBAXIN) 500 MG tablet Take 1 tablet (500 mg total) by mouth 2 (two) times daily. 09/27/20   Dartha Lodge, PA-C  naproxen (NAPROSYN) 500 MG tablet Take 1 tablet (500 mg total) by mouth 2 (two) times daily.  09/27/20   Dartha Lodge, PA-C    Allergies    Sulfa antibiotics  Review of Systems   Review of Systems  Constitutional:  Negative for chills and fever.  Gastrointestinal:  Positive for abdominal pain. Negative for constipation, diarrhea, nausea and vomiting.  All other systems reviewed and are negative.  Physical Exam Updated Vital Signs BP (!) 114/100   Pulse 100   Temp 98.9 F (37.2 C) (Oral)   Resp 17   SpO2 100%   Physical Exam Vitals and nursing note reviewed.  Constitutional:      Appearance: He is not ill-appearing.  HENT:     Head: Normocephalic and atraumatic.     Mouth/Throat:     Comments: Slightly dry MM Eyes:     Conjunctiva/sclera: Conjunctivae normal.  Cardiovascular:     Rate and Rhythm: Normal rate and regular rhythm.     Heart sounds: Normal heart sounds.  Pulmonary:     Effort: Pulmonary effort is normal.     Breath sounds: Normal breath sounds. No wheezing, rhonchi or rales.  Abdominal:     Palpations: Abdomen is soft.     Tenderness: There is no abdominal tenderness. There is no guarding or rebound.  Musculoskeletal:     Cervical back: Neck supple.  Skin:    General: Skin is warm and dry.  Neurological:     Mental Status: He is alert.    ED Results / Procedures / Treatments   Labs (all labs ordered are listed, but only abnormal results are displayed) Labs Reviewed  COMPREHENSIVE METABOLIC PANEL - Abnormal; Notable for the following components:      Result Value   Sodium 134 (*)    Glucose, Bld 100 (*)    Creatinine, Ser 1.33 (*)    Total Protein 8.5 (*)    Total Bilirubin 1.4 (*)    All other components within normal limits  URINALYSIS, ROUTINE W REFLEX MICROSCOPIC - Abnormal; Notable for the following components:   Color, Urine AMBER (*)    APPearance HAZY (*)    Ketones, ur 20 (*)    Protein, ur 30 (*)    All other components within normal limits  LIPASE, BLOOD  CBC    EKG None  Radiology No results  found.  Procedures Procedures   Medications Ordered in ED Medications  sodium chloride 0.9 % bolus 1,000 mL (has no administration in time range)    ED Course  I have reviewed the triage vital signs and the nursing notes.  Pertinent labs & imaging results that were available during my care of the patient were reviewed by me and considered in my medical decision making (see chart for details).    MDM Rules/Calculators/A&P                          46 year old male who presents to the ED today with complaint of diffuse abdominal discomfort as well as concern for dehydration for the past 2 days after eating spaghetti at a restaurant.  No nausea, vomiting, diarrhea.  On arrival to the ED today patient is slightly tachycardic in the 110s.  He is afebrile, nontachypneic, nontoxic-appearing.  On my exam he has no abdominal discomfort/tenderness palpation.  Low suspicion for acute abdomen at this time.  He had lab work done while in the waiting room.  CBC without leukocytosis and hemoglobin stable at 16.6.  CMP with a sodium 134, glucose 100.  Creatinine slightly elevated at 1.33 however GFR greater than 60.  T bili slightly elevated 1.4, remainder of LFTs unremarkable.  No right upper quadrant tenderness palpation at this time.  Lipase 43.  Urinalysis with normal specific gravity however does have ketones and protein in urine.  On my exam he has slightly dry mucous membranes.  Really Pete vitals taken 4 hours later with continued tachycardia at 100.  Given this we will plan for IV fluids and reassessment.  I do not feel patient needs imaging of his abdomen at this time as I is low suspicion for acute abnormality.  He does mention history of abdominal pain intermittently for the past 8 months, was advised at another local ER that he would need an endoscopy however never followed up.  He is requesting GI referral at this time.   Nursing staff informed that patient does not want IV fluids at this time.  He  has been able to successfully drink water and juice here without emesis.  He states that he would like to be discharged with a work note at this time.  Think this is reasonable today given overall well appearance.  I have encouraged increasing water intake at home given his slightly tacky mucous membranes.  To follow-up with GI given persistent abdominal issues plus or minus endoscopy.  He is in agreement with plan and stable  for discharge.   This note was prepared using Dragon voice recognition software and may include unintentional dictation errors due to the inherent limitations of voice recognition software.   Final Clinical Impression(s) / ED Diagnoses Final diagnoses:  Generalized abdominal pain    Rx / DC Orders ED Discharge Orders     None        Discharge Instructions      Please increase the amount of water you drink daily to help with your feelings of dehydration.  You can follow up with [facility] for further evaluation of your ongoing abdominal pain.   Return to the ED for any new/worsening symptoms       Tanda Rockers, Cordelia Poche 02/01/21 1811    Wynetta Fines, MD 02/02/21 [phone]

## 2021-02-01 NOTE — ED Provider Notes (Signed)
Emergency Medicine Provider Triage Evaluation Note  Phillip Barnett , a 45 y.o. male  was evaluated in triage.  Pt complains of abdominal cramping.  Patient reports that please see.  Since then he has been having generalized abdominal cramping.  Reports a decreased appetite over the last few days.  Patient denies any nausea, vomiting, or diarrhea.    Review of Systems  Positive: Abdominal cramping Negative: nausea, vomiting, diarrhea, fever, chills, dysuria, hematuria  Physical Exam  BP (!) 148/98 (BP Location: Right Arm)   Pulse (!) 119   Temp 98.9 F (37.2 C) (Oral)   Resp 16   SpO2 99%  Gen:   Awake, no distress   Resp:  Normal effort  MSK:   Moves extremities without difficulty  Other:  Abdomen soft, nondistended, nontender  Medical Decision Making  Medically screening exam initiated at 4:01 PM.  Appropriate orders placed.  Phillip Barnett was informed that the remainder of the evaluation will be completed by another provider, this initial triage assessment does not replace that evaluation, and the importance of remaining in the ED until their evaluation is complete.  The patient appears stable so that the remainder of the work up may be completed by another provider.      Phillip Barnett 02/01/21 1604    Koleen Distance, MD 02/01/21 1710

## 2021-02-01 NOTE — ED Notes (Signed)
Pt drank apple juice and water with no problems.

## 2021-02-01 NOTE — ED Triage Notes (Signed)
Pt believes he ate "bad food" at [employer] on Friday, dehydration & abdominal pain since. Denies nausea, vomiting, diarrhea

## 2021-02-01 NOTE — Discharge Instructions (Addendum)
Please increase the amount of water you drink daily to help with your feelings of dehydration.  You can follow up with [facility] for further evaluation of your ongoing abdominal pain.   Return to the ED for any new/worsening symptoms

## 2021-11-28 ENCOUNTER — Emergency Department (HOSPITAL_COMMUNITY)
Admission: EM | Admit: 2021-11-28 | Discharge: 2021-11-28 | Disposition: A | Discharge status: Home / Self Care | Payer: Medicaid Other | Attending: Emergency Medicine | Admitting: Emergency Medicine

## 2021-11-28 ENCOUNTER — Other Ambulatory Visit: Payer: Self-pay

## 2021-11-28 ENCOUNTER — Emergency Department (HOSPITAL_COMMUNITY): Payer: Medicaid Other

## 2021-11-28 ENCOUNTER — Encounter (HOSPITAL_COMMUNITY): Payer: Self-pay | Admitting: Emergency Medicine

## 2021-11-28 DIAGNOSIS — R0789 Other chest pain: Secondary | ICD-10-CM | POA: Diagnosis present

## 2021-11-28 DIAGNOSIS — R0602 Shortness of breath: Secondary | ICD-10-CM | POA: Insufficient documentation

## 2021-11-28 DIAGNOSIS — M546 Pain in thoracic spine: Secondary | ICD-10-CM | POA: Diagnosis not present

## 2021-11-28 DIAGNOSIS — Z79899 Other long term (current) drug therapy: Secondary | ICD-10-CM | POA: Diagnosis not present

## 2021-11-28 DIAGNOSIS — R790 Abnormal level of blood mineral: Secondary | ICD-10-CM | POA: Diagnosis not present

## 2021-11-28 DIAGNOSIS — R1032 Left lower quadrant pain: Secondary | ICD-10-CM | POA: Insufficient documentation

## 2021-11-28 DIAGNOSIS — R748 Abnormal levels of other serum enzymes: Secondary | ICD-10-CM | POA: Insufficient documentation

## 2021-11-28 DIAGNOSIS — R079 Chest pain, unspecified: Secondary | ICD-10-CM

## 2021-11-28 DIAGNOSIS — M549 Dorsalgia, unspecified: Secondary | ICD-10-CM

## 2021-11-28 DIAGNOSIS — F17200 Nicotine dependence, unspecified, uncomplicated: Secondary | ICD-10-CM | POA: Insufficient documentation

## 2021-11-28 LAB — BASIC METABOLIC PANEL
Anion gap: 7 (ref 5–15)
BUN: 10 mg/dL (ref 6–20)
CO2: 25 mmol/L (ref 22–32)
Calcium: 9.1 mg/dL (ref 8.9–10.3)
Chloride: 105 mmol/L (ref 98–111)
Creatinine, Ser: 1.12 mg/dL (ref 0.61–1.24)
GFR, Estimated: >60 mL/min (ref >60)
Glucose, Bld: 118 mg/dL — ABNORMAL HIGH (ref 70–99)
Potassium: 4.1 mmol/L (ref 3.5–5.1)
Sodium: 137 mmol/L (ref 135–145)

## 2021-11-28 LAB — CBC
HCT: 46.2 % (ref 39.0–52.0)
Hemoglobin: 15.2 g/dL (ref 13.0–17.0)
MCH: 29.5 pg (ref 26.0–34.0)
MCHC: 32.9 g/dL (ref 30.0–36.0)
MCV: 89.5 fL (ref 80.0–100.0)
Platelets: 258 10*3/uL (ref 150–400)
RBC: 5.16 MIL/uL (ref 4.22–5.81)
RDW: 13.6 % (ref 11.5–15.5)
WBC: 7.2 10*3/uL (ref 4.0–10.5)
nRBC: 0 % (ref 0.0–0.2)

## 2021-11-28 LAB — RAPID URINE DRUG SCREEN, HOSP PERFORMED
Amphetamines: NOT DETECTED
Barbiturates: NOT DETECTED
Benzodiazepines: NOT DETECTED
Cocaine: NOT DETECTED
Opiates: NOT DETECTED
Tetrahydrocannabinol: POSITIVE — AB

## 2021-11-28 LAB — URINALYSIS, ROUTINE W REFLEX MICROSCOPIC
Bilirubin Urine: NEGATIVE
Glucose, UA: NEGATIVE mg/dL
Hgb urine dipstick: NEGATIVE
Ketones, ur: NEGATIVE mg/dL
Leukocytes,Ua: NEGATIVE
Nitrite: NEGATIVE
Protein, ur: NEGATIVE mg/dL
Specific Gravity, Urine: 1.02 (ref 1.005–1.030)
pH: 7 (ref 5.0–8.0)

## 2021-11-28 LAB — LIPASE, BLOOD: Lipase: 64 U/L — ABNORMAL HIGH (ref 11–51)

## 2021-11-28 LAB — TROPONIN I (HIGH SENSITIVITY)
Troponin I (High Sensitivity): 4 ng/L (ref <18)
Troponin I (High Sensitivity): 3 ng/L (ref <18)

## 2021-11-28 MED ORDER — METHOCARBAMOL 500 MG PO TABS: 500.0000 mg | ORAL_TABLET | Freq: Two times a day (BID) | ORAL | 0 refills | Status: DC

## 2021-11-28 NOTE — ED Triage Notes (Signed)
C/o L sided chest pain that radiates to back x 2-3 days.  Denies SOB, nausea, and vomiting.  States he has been working out. ?

## 2021-11-28 NOTE — ED Notes (Signed)
Patient requesting [place] sandwich and drinks.  ?

## 2021-11-28 NOTE — ED Provider Notes (Signed)
?[place] ?Provider Note ? ? ?CSN: [id number] ?Arrival date & time: 11/28/21  1553 ? ?  ? ?History ?Chief Complaint  ?Patient presents with  ? Chest Pain  ? ? ?Phillip Barnett is a 46 y.o. male with h/o polysubstance use disorder presents to the ED for evaluation of waxing and waning left upper back pain radiating to his left chest for the past 4-5 days. The patient reports he is having worsening pain with moving his arms and bending down. He described the pain as sharp and shooting. He denies any SOB at rest, but reports he has noticed he has been mildly SOB with some exertion and contributes it to his smoking. He denies any leg swelling, palpitations, nausea, vomiting, diaphoresis, lightheadedness, or fever. He reports LLQ abdominal pain that has been occurring for over a year. The patient denies any melena or hematochezia.  He denies any trauma or injury to the area.  Denies any MVC's or heavy lifting.  He is 1/4 pack per day smoker, consumes 4-5 beers daily, and has a h/o MJ and cocaine use.  ? ? ?Chest Pain ?Associated symptoms: abdominal pain, back pain and shortness of breath   ?Associated symptoms: no cough, no diaphoresis, no fever, no nausea, no palpitations, no vomiting and no weakness   ? ?  ? ?Home Medications ?Prior to Admission medications   ?Medication Sig Start Date End Date Taking? Authorizing Provider  ?methocarbamol (ROBAXIN) 500 MG tablet Take 1 tablet (500 mg total) by mouth 2 (two) times daily. 09/27/20   Jacqlyn Larsen, PA-C  ?naproxen (NAPROSYN) 500 MG tablet Take 1 tablet (500 mg total) by mouth 2 (two) times daily. 09/27/20   Jacqlyn Larsen, PA-C  ?   ? ?Allergies    ?Sulfa antibiotics   ? ?Review of Systems   ?Review of Systems  ?Constitutional:  Negative for diaphoresis and fever.  ?Respiratory:  Positive for shortness of breath. Negative for cough.   ?Cardiovascular:  Positive for chest pain. Negative for palpitations and leg swelling.   ?Gastrointestinal:  Positive for abdominal pain. Negative for blood in stool, constipation, diarrhea, nausea and vomiting.  ?Genitourinary:  Negative for dysuria and hematuria.  ?Musculoskeletal:  Positive for back pain. Negative for neck pain.  ?Neurological:  Negative for weakness and light-headedness.  ? ?Physical Exam ?Updated Vital Signs ?BP 131/89   Pulse 62   Temp 97.6 ?F (36.4 ?C) (Oral)   Resp 13   SpO2 100%  ?Physical Exam ?Constitutional:   ?   General: He is not in acute distress. ?   Appearance: Normal appearance. He is not ill-appearing or toxic-appearing.  ?HENT:  ?   Head: Normocephalic and atraumatic.  ?Eyes:  ?   General: No scleral icterus. ?Cardiovascular:  ?   Rate and Rhythm: Normal rate and regular rhythm.  ?   Pulses:     ?     Radial pulses are 2+ on the right side and 2+ on the left side.  ?     Dorsalis pedis pulses are 2+ on the right side and 2+ on the left side.  ?     Posterior tibial pulses are 2+ on the right side and 2+ on the left side.  ?   Heart sounds: Normal heart sounds.  ?Pulmonary:  ?   Effort: Pulmonary effort is normal.  ?   Breath sounds: Normal breath sounds.  ?Chest:  ?   Chest wall: Tenderness present.  ?  Comments: Tenderness to the left lateral pectoralis. No overlying skin changes or warmth to the area. No signs of injury or trauma. No swelling noted. No mass or crepitus palpated.  ?Abdominal:  ?   General: Abdomen is flat. Bowel sounds are normal.  ?   Palpations: Abdomen is soft.  ?   Tenderness: There is no guarding or rebound.  ?   Comments: Some very mild LLQ tenderness which patient reports is chronic for him. Abdomen is soft without any guarding or rebound noted.  No overlying skin changes noted.  Normal active bowel sounds.  ?Musculoskeletal:     ?   General: No deformity.  ?     Arms: ? ?   Cervical back: Normal range of motion.  ?   Right lower leg: No tenderness. No edema.  ?   Left lower leg: No tenderness. No edema.  ?   Comments: No midline  cervical, thoracic, or lumbar tenderness.  The patient has tenderness to his upper paraspinal low C-spine/upper T-spine.  Tenderness in between his left scapula edge and his spine.  No overlying skin changes to the area.  No increased warmth or erythema.  No swelling noted.  No signs of trauma.   ?Skin: ?   General: Skin is warm and dry.  ?Neurological:  ?   General: No focal deficit present.  ?   Mental Status: He is alert. Mental status is at baseline.  ? ? ?ED Results / Procedures / Treatments   ?Labs ?(all labs ordered are listed, but only abnormal results are displayed) ?Labs Reviewed  ?BASIC METABOLIC PANEL - Abnormal; Notable for the following components:  ?    Result Value  ? Glucose, Bld 118 (*)   ? All other components within normal limits  ?CBC  ?LIPASE, BLOOD  ?TROPONIN I (HIGH SENSITIVITY)  ?TROPONIN I (HIGH SENSITIVITY)  ? ? ?EKG ?EKG Interpretation ? ?Date/Time:  Saturday November 28 2021 15:59:45 EDT ?Ventricular Rate:  78 ?PR Interval:  194 ?QRS Duration: 88 ?QT Interval:  376 ?QTC Calculation: 428 ?R Axis:   96 ?Text Interpretation: ** Poor data quality, interpretation may be adversely affected Normal sinus rhythm Rightward axis Biventricular hypertrophy Abnormal ECG When compared with ECG of 27-Sep-2020 08:45, PREVIOUS ECG IS PRESENT Confirmed by Edwin DadaGray, Alicia ([id number]) on 11/28/2021 8:24:08 PM ? ?Radiology ?DG Chest 2 View ? ?Result Date: 11/28/2021 ?CLINICAL DATA:  Chest pain EXAM: CHEST - 2 VIEW COMPARISON:  09/27/2020 FINDINGS: The heart size and mediastinal contours are within normal limits. Both lungs are clear. The visualized skeletal structures are unremarkable. IMPRESSION: No active cardiopulmonary disease. Electronically Signed   By: Ernie AvenaPalani  Rathinasamy M.D.   On: 11/28/2021 16:40   ? ?Procedures ?Procedures  ? ?Medications Ordered in ED ?Medications - No data to display ? ?ED Course/ Medical Decision Making/ A&P ? ?                        ?Medical Decision Making ?Amount and/or Complexity of Data  Reviewed ?Labs: ordered. ?Radiology: ordered. Decision-making details documented in ED Course. ? ?Risk ?Prescription drug management. ? ? ?46 y/o M presents to the ED for evaluation of back pain radiating to the chest pain. Differential diagnosis includes but is not limited to MSK, dissection, pneumonia, pancreatitis, ACS, PE. Vital signs show mild hypertension at 135/95.  Afebrile, normal pulse rate, satting well on room air without any increased work of breathing.  Physical exam is pertinent for tenderness to the  left lateral pectoralis. No overlying skin changes or warmth to the area. No signs of injury or trauma. No swelling noted. No mass or crepitus palpated. No midline cervical, thoracic, or lumbar tenderness.  The patient has tenderness to his upper paraspinal low C-spine/upper T-spine.  Tenderness in between his left scapula edge and his spine.  No overlying skin changes to the area.  No increased warmth or erythema.  No swelling noted.  No signs of trauma. Some very mild LLQ tenderness which patient reports is chronic for him. Abdomen is soft without any guarding or rebound noted.  No overlying skin changes noted.  Normal active bowel sounds.    ? ?Labs and imaging ordered in triage. ? ?I independently reviewed and interpreted the patient's labs and imaging and agree with radiologist interpretation.  BMP shows mild elevation glucose at 118 however not fasting.  No electrolyte derangements.  CBC shows no leukocytosis or anemia.  Initial troponin 4 with repeat of 3, delta -1.  Lipase is slightly elevated at 64.  Urinalysis is unremarkable although his UDS is positive for THC.  Chest x-ray shows no active cardiopulmonary process.  EKG shows poor data quality, interpretation may be adversely affected Normal sinus rhythm, Rightward axis, Biventricular hypertrophy. ? ?The patient is PERC negative in low risk [place].  I doubt any PE at this time.  Labs and EKG are not consistent with any ACS.  I doubt any  pancreatitis given his only slightly elevated lipase and has no abdominal tenderness other than the left lower quadrant that is been chronic for over a year.  There is no signs of pneumonia and the patient has clear to auscultation l

## 2021-11-28 NOTE — Discharge Instructions (Addendum)
You were seen in the ER for evaluation of your chest and back pain. Your labs, EKG, and imaging were unremarkable. This is likely muscular pain. However, I have included the information to a cardiologist group for you to call to schedule and appointment with. I am sending you home with a muscle relaxer for pain. Please do not drive or operate heavy machinery while on this medication as it can make you drowsy. You can take Tylenol or ibuprofen as needed for pain. I have attached additional information on back pain and chest pain to the discharge paperwork. If you have any concern, new or worsening symptoms, please return to the nearest ER.  ? ? ?Contact a doctor if: ?Your chest pain does not go away. ?You feel depressed. ?You have a fever. ?Get help right away if: ?Your chest pain is worse. ?You have a cough that gets worse, or you cough up blood. ?You have very bad (severe) pain in your belly (abdomen). ?You pass out (faint). ?You have either of these for no clear reason: ?Sudden chest discomfort. ?Sudden discomfort in your arms, back, neck, or jaw. ?You have shortness of breath at any time. ?You suddenly start to sweat, or your skin gets clammy. ?You feel sick to your stomach (nauseous). ?You throw up (vomit). ?You suddenly feel lightheaded or dizzy. ?You feel very weak or tired. ?Your heart starts to beat fast, or it feels like it is skipping beats. ?These symptoms may be an emergency. Do not wait to see if the symptoms will go away. Get medical help right away. Call your local emergency services (911 in the [place] Do not drive yourself to the hospital. ?

## 2022-01-24 ENCOUNTER — Emergency Department (HOSPITAL_COMMUNITY)
Admission: EM | Admit: 2022-01-24 | Discharge: 2022-01-24 | Disposition: A | Discharge status: Home / Self Care | Payer: Medicaid Other | Attending: Emergency Medicine | Admitting: Emergency Medicine

## 2022-01-24 ENCOUNTER — Encounter (HOSPITAL_COMMUNITY): Payer: Self-pay | Admitting: Pharmacy Technician

## 2022-01-24 ENCOUNTER — Other Ambulatory Visit: Payer: Self-pay

## 2022-01-24 DIAGNOSIS — Z23 Encounter for immunization: Secondary | ICD-10-CM | POA: Insufficient documentation

## 2022-01-24 DIAGNOSIS — S61219A Laceration without foreign body of unspecified finger without damage to nail, initial encounter: Secondary | ICD-10-CM

## 2022-01-24 DIAGNOSIS — W260XXA Contact with knife, initial encounter: Secondary | ICD-10-CM | POA: Insufficient documentation

## 2022-01-24 DIAGNOSIS — S61012A Laceration without foreign body of left thumb without damage to nail, initial encounter: Secondary | ICD-10-CM | POA: Insufficient documentation

## 2022-01-24 DIAGNOSIS — S6992XA Unspecified injury of left wrist, hand and finger(s), initial encounter: Secondary | ICD-10-CM | POA: Diagnosis present

## 2022-01-24 MED ORDER — LIDOCAINE HCL 2 % IJ SOLN
10.0000 mL | Freq: Once | INTRAMUSCULAR | Status: DC
  Filled 2022-01-24: qty 20

## 2022-01-24 MED ORDER — TETANUS-DIPHTHERIA TOXOIDS TD 2-2 LF/0.5ML IM SUSP: 0.5000 mL | Freq: Once | INTRAMUSCULAR | 0 refills | Status: AC

## 2022-01-24 MED ORDER — TETANUS-DIPHTH-ACELL PERTUSSIS 5-2.5-18.5 LF-MCG/0.5 IM SUSY
0.5000 mL | PREFILLED_SYRINGE | Freq: Once | INTRAMUSCULAR | Status: AC
  Administered 2022-01-24: 0.5 mL via INTRAMUSCULAR
  Filled 2022-01-24: qty 0.5

## 2022-06-17 ENCOUNTER — Other Ambulatory Visit: Payer: Self-pay

## 2022-06-17 ENCOUNTER — Emergency Department (HOSPITAL_COMMUNITY)
Admission: EM | Admit: 2022-06-17 | Discharge: 2022-06-17 | Disposition: A | Discharge status: Home / Self Care | Payer: 59 | Attending: Emergency Medicine | Admitting: Emergency Medicine

## 2022-06-17 ENCOUNTER — Encounter (HOSPITAL_COMMUNITY): Payer: Self-pay

## 2022-06-17 DIAGNOSIS — Z0000 Encounter for general adult medical examination without abnormal findings: Secondary | ICD-10-CM | POA: Insufficient documentation

## 2022-06-17 DIAGNOSIS — Z711 Person with feared health complaint in whom no diagnosis is made: Secondary | ICD-10-CM

## 2022-06-17 NOTE — ED Provider Notes (Signed)
[facility] EMERGENCY DEPARTMENT Provider Note   CSN: [id number] Arrival date & time: 06/17/22  2206     History  No chief complaint on file.   Phillip Barnett is a 46 y.o. male.  Patient with history of alcohol abuse presents today with EMS without any medical complaints. He states that he was walking on the side of the street and police stopped him and told him that he could either go to the hospital or jail. Patient stated he did not want to go to either but would prefer the hospital over jail. Police then called EMS for transport. Patient arrives without any complaints. [employer] did not come to the hospital with the patient. He states that he was drinking alcohol tonight. Denies any SI, HI or AVH. He is ambulatory to triage and is alert and oriented.  The history is provided by the patient. No language interpreter was used.       Home Medications Prior to Admission medications   Medication Sig Start Date End Date Taking? Authorizing Provider  methocarbamol (ROBAXIN) 500 MG tablet Take 1 tablet (500 mg total) by mouth 2 (two) times daily. 11/28/21   Achille Rich, PA-C  naproxen (NAPROSYN) 500 MG tablet Take 1 tablet (500 mg total) by mouth 2 (two) times daily. 09/27/20   Dartha Lodge, PA-C      Allergies    Sulfa antibiotics    Review of Systems   Review of Systems  All other systems reviewed and are negative.   Physical Exam Updated Vital Signs Ht 6\' 3"  (1.905 m)   Wt 79.4 kg   BMI 21.87 kg/m  Physical Exam Vitals and nursing note reviewed.  Constitutional:      General: He is not in acute distress.    Appearance: Normal appearance. He is normal weight. He is not ill-appearing, toxic-appearing or diaphoretic.  HENT:     Head: Normocephalic and atraumatic.  Cardiovascular:     Rate and Rhythm: Normal rate.  Pulmonary:     Effort: Pulmonary effort is normal. No respiratory distress.  Musculoskeletal:        General: Normal range of motion.      Cervical back: Normal range of motion.  Skin:    General: Skin is warm and dry.  Neurological:     General: No focal deficit present.     Mental Status: He is alert.  Psychiatric:        Mood and Affect: Mood normal.        Behavior: Behavior normal.     Comments: Does not appear to be responding to internal stimuli     ED Results / Procedures / Treatments   Labs (all labs ordered are listed, but only abnormal results are displayed) Labs Reviewed - No data to display  EKG None  Radiology No results found.  Procedures Procedures    Medications Ordered in ED Medications - No data to display  ED Course/ Medical Decision Making/ A&P                           Medical Decision Making  Patient presents today via EMS without any medical complaints. States that [employer] forced him to come here because he was walking the street. States he tried to get police to take him to his friends house but they told him that jail and the hospital were his only options. Patient states that he has been drinking alcohol  this evening but no more than he normally drinks. He is afebrile, non-toxic appearing, and in no acute distress with reassuring vital signs. He is also alert and oriented and neurologically intact without focal deficits. He is ambulatory to triage with a steady gait. He denies SI/HI, auditory, or visual hallucinations.  He denies any medical complaints and states he would like to leave. He is stable to be discharged. Educated on red flag symptoms that would prompt immediate return. Patient understanding and amenable with plan, discharged in stable condition.   Final Clinical Impression(s) / ED Diagnoses Final diagnoses:  No problem, feared complaint unfounded    Rx / DC Orders ED Discharge Orders     None     An After Visit Summary was printed and given to the patient.     Vear Clock 06/17/22 2224    Shon Baton, MD 06/17/22 [phone]

## 2022-06-17 NOTE — ED Triage Notes (Signed)
PER EMS: pt was walking around on the side of a road and was found by [employer] who told him he could go to the hospital or be taken to jail.  + ETOH. Pt reports he does not want to be here.  BP- 114/98, HR-100, 96%

## 2022-06-17 NOTE — Discharge Instructions (Signed)
Return if development of any new or worsening symptoms 

## 2022-06-17 NOTE — ED Notes (Signed)
Pt admits to drinking alcohol tonight. He is alert and ambulatory with independent steady gait.

## 2022-06-18 ENCOUNTER — Emergency Department (HOSPITAL_COMMUNITY): Payer: 59

## 2022-06-18 ENCOUNTER — Encounter (HOSPITAL_COMMUNITY): Payer: Self-pay

## 2022-06-18 ENCOUNTER — Other Ambulatory Visit: Payer: Self-pay

## 2022-06-18 ENCOUNTER — Emergency Department (HOSPITAL_COMMUNITY)
Admission: EM | Admit: 2022-06-18 | Discharge: 2022-06-18 | Disposition: A | Discharge status: Home / Self Care | Payer: 59 | Attending: Emergency Medicine | Admitting: Emergency Medicine

## 2022-06-18 DIAGNOSIS — Y907 Blood alcohol level of 200-239 mg/100 ml: Secondary | ICD-10-CM | POA: Insufficient documentation

## 2022-06-18 DIAGNOSIS — F10220 Alcohol dependence with intoxication, uncomplicated: Secondary | ICD-10-CM | POA: Diagnosis not present

## 2022-06-18 DIAGNOSIS — F10920 Alcohol use, unspecified with intoxication, uncomplicated: Secondary | ICD-10-CM

## 2022-06-18 DIAGNOSIS — Z23 Encounter for immunization: Secondary | ICD-10-CM | POA: Diagnosis not present

## 2022-06-18 DIAGNOSIS — S0990XA Unspecified injury of head, initial encounter: Secondary | ICD-10-CM | POA: Diagnosis present

## 2022-06-18 DIAGNOSIS — S0101XA Laceration without foreign body of scalp, initial encounter: Secondary | ICD-10-CM | POA: Diagnosis not present

## 2022-06-18 LAB — CBC WITH DIFFERENTIAL/PLATELET
Abs Immature Granulocytes: 0.04 10*3/uL (ref 0.00–0.07)
Basophils Absolute: 0 10*3/uL (ref 0.0–0.1)
Basophils Relative: 0 %
Eosinophils Absolute: 0 10*3/uL (ref 0.0–0.5)
Eosinophils Relative: 0 %
HCT: 48 % (ref 39.0–52.0)
Hemoglobin: 15.7 g/dL (ref 13.0–17.0)
Immature Granulocytes: 0 %
Lymphocytes Relative: 7 %
Lymphs Abs: 0.9 10*3/uL (ref 0.7–4.0)
MCH: 29.7 pg (ref 26.0–34.0)
MCHC: 32.7 g/dL (ref 30.0–36.0)
MCV: 90.9 fL (ref 80.0–100.0)
Monocytes Absolute: 0.8 10*3/uL (ref 0.1–1.0)
Monocytes Relative: 6 %
Neutro Abs: 10.8 10*3/uL — ABNORMAL HIGH (ref 1.7–7.7)
Neutrophils Relative %: 87 %
Platelets: 269 10*3/uL (ref 150–400)
RBC: 5.28 MIL/uL (ref 4.22–5.81)
RDW: 13.6 % (ref 11.5–15.5)
WBC: 12.5 10*3/uL — ABNORMAL HIGH (ref 4.0–10.5)
nRBC: 0 % (ref 0.0–0.2)

## 2022-06-18 LAB — I-STAT CHEM 8, ED
BUN: 11 mg/dL (ref 6–20)
Calcium, Ion: 1.07 mmol/L — ABNORMAL LOW (ref 1.15–1.40)
Chloride: 105 mmol/L (ref 98–111)
Creatinine, Ser: 1.8 mg/dL — ABNORMAL HIGH (ref 0.61–1.24)
Glucose, Bld: 96 mg/dL (ref 70–99)
HCT: 50 % (ref 39.0–52.0)
Hemoglobin: 17 g/dL (ref 13.0–17.0)
Potassium: 4.3 mmol/L (ref 3.5–5.1)
Sodium: 145 mmol/L (ref 135–145)
TCO2: 26 mmol/L (ref 22–32)

## 2022-06-18 LAB — PROTIME-INR
INR: 1.1 (ref 0.8–1.2)
Prothrombin Time: 14.1 seconds (ref 11.4–15.2)

## 2022-06-18 LAB — COMPREHENSIVE METABOLIC PANEL
ALT: 17 U/L (ref 0–44)
AST: 25 U/L (ref 15–41)
Albumin: 4 g/dL (ref 3.5–5.0)
Alkaline Phosphatase: 64 U/L (ref 38–126)
Anion gap: 11 (ref 5–15)
BUN: 10 mg/dL (ref 6–20)
CO2: 25 mmol/L (ref 22–32)
Calcium: 8.7 mg/dL — ABNORMAL LOW (ref 8.9–10.3)
Chloride: 107 mmol/L (ref 98–111)
Creatinine, Ser: 1.41 mg/dL — ABNORMAL HIGH (ref 0.61–1.24)
GFR, Estimated: >60 mL/min (ref >60)
Glucose, Bld: 101 mg/dL — ABNORMAL HIGH (ref 70–99)
Potassium: 4.3 mmol/L (ref 3.5–5.1)
Sodium: 143 mmol/L (ref 135–145)
Total Bilirubin: 0.4 mg/dL (ref 0.3–1.2)
Total Protein: 8.3 g/dL — ABNORMAL HIGH (ref 6.5–8.1)

## 2022-06-18 LAB — SAMPLE TO BLOOD BANK

## 2022-06-18 LAB — ETHANOL: Alcohol, Ethyl (B): 226 mg/dL — ABNORMAL HIGH (ref <10)

## 2022-06-18 LAB — LACTIC ACID, PLASMA: Lactic Acid, Venous: 2.7 mmol/L (ref 0.5–1.9)

## 2022-06-18 MED ORDER — TETANUS-DIPHTH-ACELL PERTUSSIS 5-2.5-18.5 LF-MCG/0.5 IM SUSY
0.5000 mL | PREFILLED_SYRINGE | Freq: Once | INTRAMUSCULAR | Status: AC
  Administered 2022-06-18: 0.5 mL via INTRAMUSCULAR
  Filled 2022-06-18: qty 0.5

## 2022-06-18 NOTE — ED Notes (Signed)
Discharge instructions reviewed with caregiver at the bedside. They indicated understanding of the same. Patient ambulated to a wheelchair and then assisted outside where he remained in the custody of [employer]. RN assisted patient into patrol car.

## 2022-06-18 NOTE — ED Notes (Signed)
Patient ate crackers and drank sprite. Patient ambulated a few steps, complaining of right knee pain. Provider notified.
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
## 2022-06-30 ENCOUNTER — Emergency Department (HOSPITAL_COMMUNITY): Payer: 59

## 2022-06-30 ENCOUNTER — Other Ambulatory Visit: Payer: Self-pay

## 2022-06-30 ENCOUNTER — Emergency Department (HOSPITAL_COMMUNITY)
Admission: EM | Admit: 2022-06-30 | Discharge: 2022-06-30 | Disposition: A | Discharge status: Home / Self Care | Payer: 59 | Attending: Emergency Medicine | Admitting: Emergency Medicine

## 2022-06-30 DIAGNOSIS — S0101XD Laceration without foreign body of scalp, subsequent encounter: Secondary | ICD-10-CM | POA: Diagnosis not present

## 2022-06-30 DIAGNOSIS — X58XXXD Exposure to other specified factors, subsequent encounter: Secondary | ICD-10-CM | POA: Diagnosis not present

## 2022-06-30 DIAGNOSIS — Z4802 Encounter for removal of sutures: Secondary | ICD-10-CM | POA: Insufficient documentation

## 2022-06-30 DIAGNOSIS — M25561 Pain in right knee: Secondary | ICD-10-CM | POA: Diagnosis present

## 2022-06-30 MED ORDER — BACITRACIN ZINC 500 UNIT/GM EX OINT: TOPICAL_OINTMENT | Freq: Once | CUTANEOUS | Status: AC

## 2022-06-30 MED ORDER — IBUPROFEN 200 MG PO TABS
600.0000 mg | ORAL_TABLET | Freq: Once | ORAL | Status: AC
  Administered 2022-06-30: 600 mg via ORAL
  Filled 2022-06-30: qty 3
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
## 2022-06-30 NOTE — Discharge Instructions (Signed)
Note the visit emergency department today was overall reassuring.  Continue using Vaseline, lotion on scar and to decrease likelihood of no disability.  Recommend using right knee brace as needed for continued support of right knee.  Recommend rest, ice, elevation, ibuprofen/Aleve as needed for right knee pain.  Recommend follow-up with orthopedics for your right knee pain for reevaluation and potential further intervention.  Please not hesitate to return to the emergency department for worrisome signs and symptoms we discussed become apparent.

## 2022-06-30 NOTE — ED Triage Notes (Addendum)
Pt to ed requesting staples to be removed from head. Also c/o right knee pain x 10 days, denies any trauma or injury.

## 2023-02-22 IMAGING — CR DG CHEST 2V
2 series · 2 of 2 positions shown · non-contrast
Comparison: 09/27/2020

CLINICAL DATA: Chest pain

EXAM:
CHEST - 2 VIEW

[chest pa]
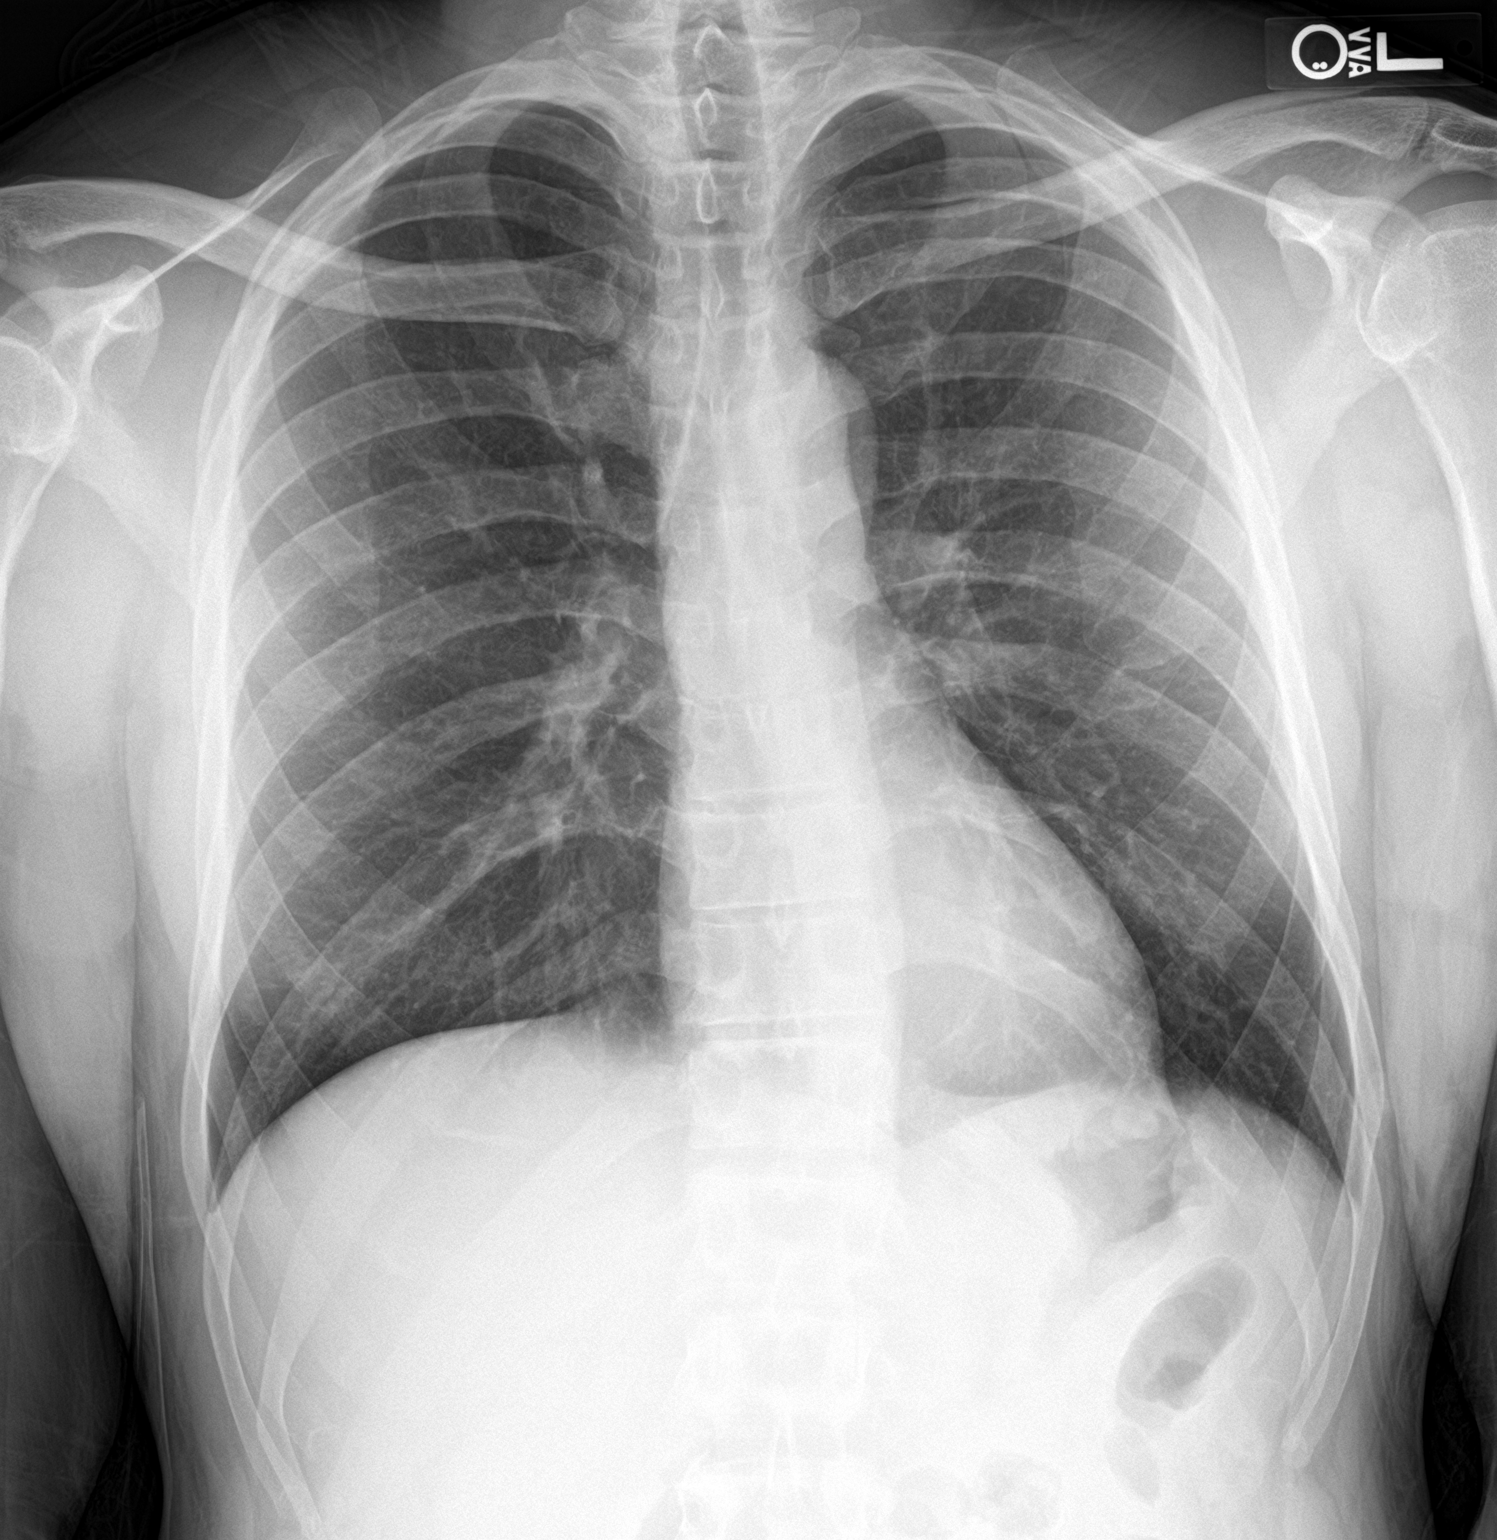

[chest lat]
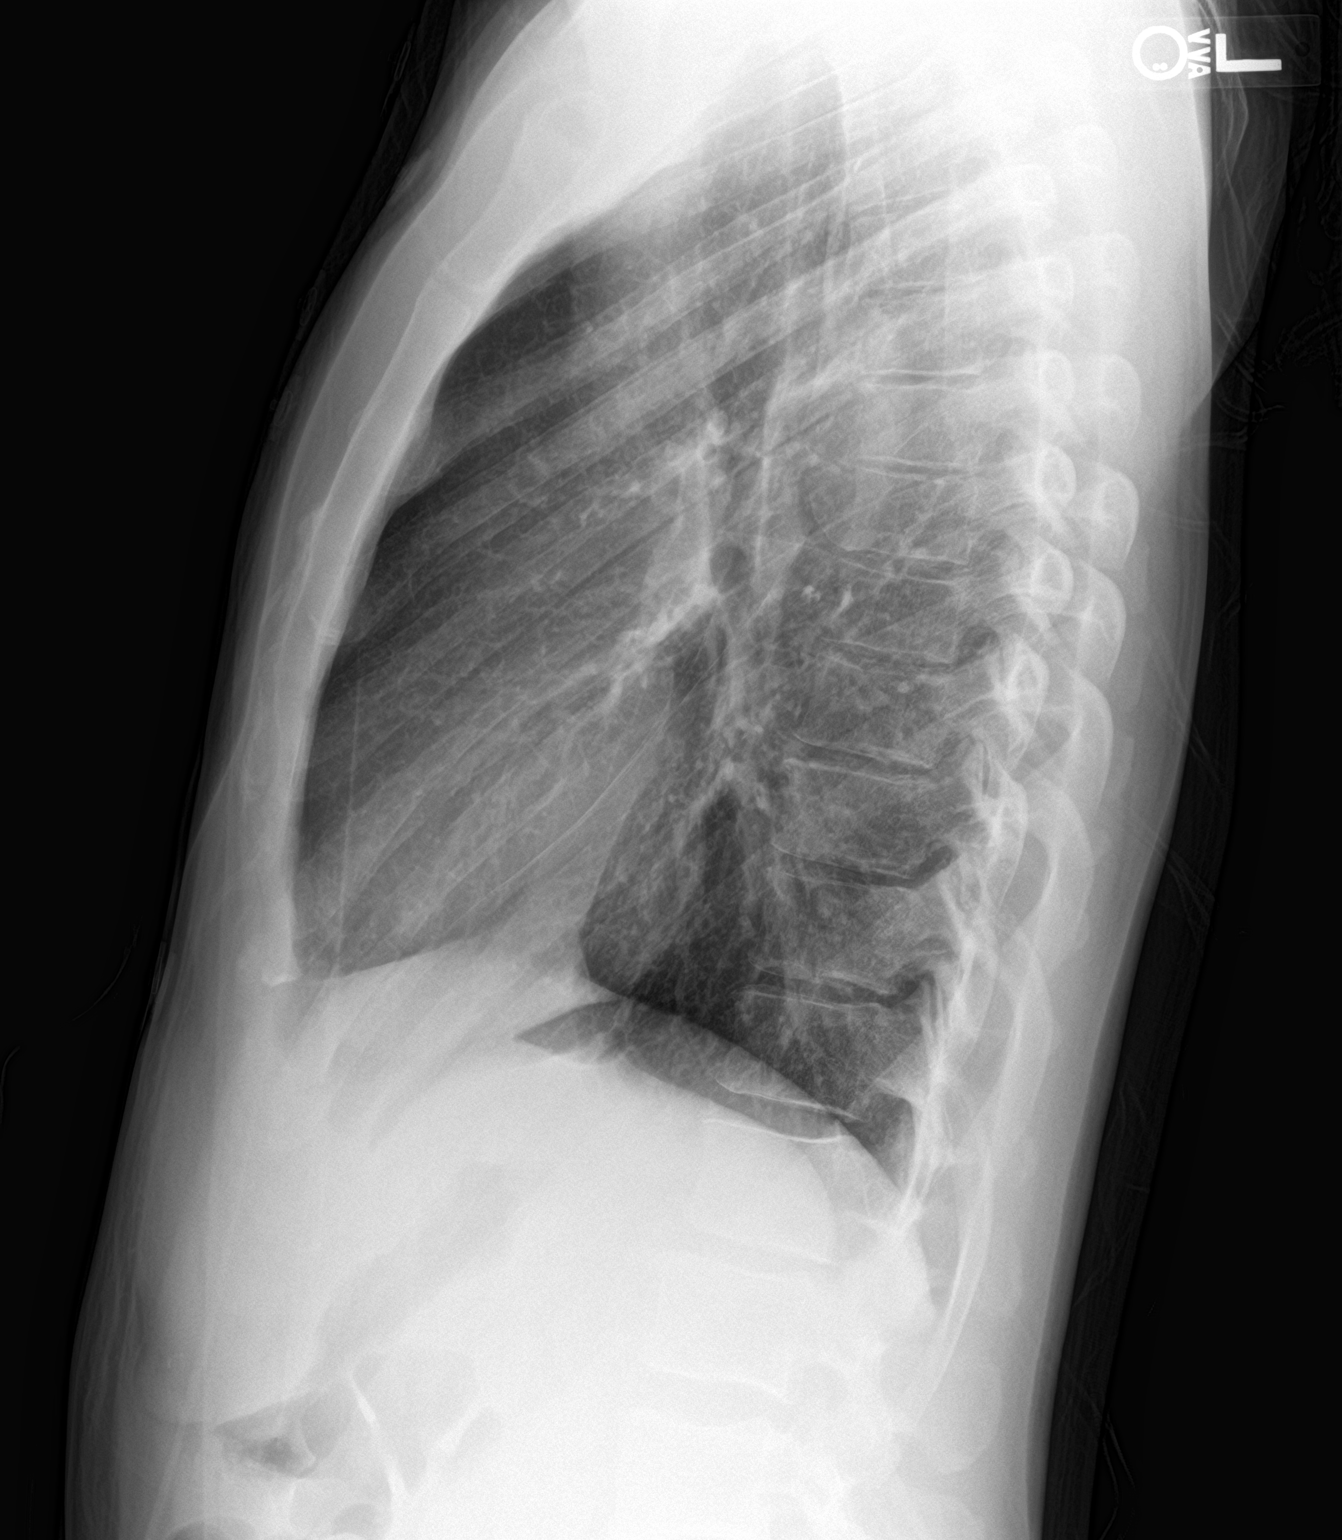

[2 of 2 positions shown; findings below may reference images not displayed]

FINDINGS: The heart size and mediastinal contours are within normal limits.
Both lungs are clear. The visualized skeletal structures are
unremarkable.
IMPRESSION: No active cardiopulmonary disease.

## 2023-03-16 ENCOUNTER — Ambulatory Visit (HOSPITAL_COMMUNITY)
Admission: EM | Admit: 2023-03-16 | Discharge: 2023-03-16 | Disposition: A | Discharge status: Home / Self Care | Payer: 59 | Attending: Family Medicine | Admitting: Family Medicine

## 2023-03-16 ENCOUNTER — Encounter (HOSPITAL_COMMUNITY): Payer: Self-pay

## 2023-03-16 DIAGNOSIS — R519 Headache, unspecified: Secondary | ICD-10-CM | POA: Diagnosis present

## 2023-03-16 DIAGNOSIS — Z1152 Encounter for screening for COVID-19: Secondary | ICD-10-CM | POA: Insufficient documentation

## 2023-03-16 DIAGNOSIS — R509 Fever, unspecified: Secondary | ICD-10-CM | POA: Diagnosis not present

## 2023-03-16 DIAGNOSIS — B349 Viral infection, unspecified: Secondary | ICD-10-CM

## 2023-03-16 MED ORDER — ACETAMINOPHEN 325 MG PO TABS
650.0000 mg | ORAL_TABLET | Freq: Once | ORAL | Status: AC
  Administered 2023-03-16: 650 mg via ORAL

## 2023-03-16 MED ORDER — ACETAMINOPHEN 325 MG PO TABS
ORAL_TABLET | ORAL | Status: AC
  Filled 2023-03-16: qty 2

## 2023-03-16 MED ORDER — IBUPROFEN 800 MG PO TABS: 800.0000 mg | ORAL_TABLET | Freq: Three times a day (TID) | ORAL | 0 refills | Status: DC

## 2023-03-16 NOTE — ED Provider Notes (Signed)
  [facility]   [id number] 03/16/23 Arrival Time: 1337  ASSESSMENT & PLAN:  1. Viral illness   2. Fever, unspecified fever cause    Wouldn't be surprised if he has COVID. Testing sent. Work note provided. Discussed typical duration of likely viral illness. OTC symptom care as needed.  New Prescriptions   IBUPROFEN (ADVIL) 800 MG TABLET    Take 1 tablet (800 mg total) by mouth 3 (three) times daily with meals.     Follow-up Information     [facility].   Specialty: Urgent Care Why: As needed. Contact information: [address] [place] [address] [phone]                Reviewed expectations re: course of current medical issues. Questions answered. Outlined signs and symptoms indicating need for more acute intervention. Understanding verbalized. After Visit Summary given.   SUBJECTIVE: History from: Patient. Phillip Barnett is a 47 y.o. male. Patient c/o headache and generalized body aches x 2 days. Overall fatigued. With mild nasal congestion. Denies n/v/d. Needs work note. No tx PTA.  OBJECTIVE:  Vitals:   03/16/23 1502  BP: 132/84  Pulse: 79  Resp: 16  Temp: (!) 101.4 F (38.6 C)  TempSrc: Oral  SpO2: 98%    General appearance: alert; no distress but appears fatigued Eyes: PERRLA; EOMI; conjunctiva normal HENT: [place]; AT; with nasal congestion Neck: supple  Lungs: speaks full sentences without difficulty; unlabored Extremities: no edema Skin: warm and dry Neurologic: normal gait Psychological: alert and cooperative; normal mood and affect   Allergies  Allergen Reactions   Sulfa Antibiotics     Unknown    Past Medical History:  Diagnosis Date   Alcohol abuse    Alcoholic pancreatitis    Social History   Socioeconomic History   Marital status: Single    Spouse name: Not on file   Number of children: Not on file   Years of education: Not on file   Highest education level: Not on  file  Occupational History   Not on file  Tobacco Use   Smoking status: Every Day    Current packs/day: 0.25    Types: Cigarettes   Smokeless tobacco: Never  Vaping Use   Vaping status: Never Used  Substance and Sexual Activity   Alcohol use: Yes   Drug use: Yes    Types: Marijuana   Sexual activity: Not on file  Other Topics Concern   Not on file  Social History Narrative   Not on file   Social Determinants of Health   Financial Resource Strain: Not on file  Food Insecurity: Not on file  Transportation Needs: Not on file  Physical Activity: Not on file  Stress: Not on file  Social Connections: Not on file  Intimate Partner Violence: Not on file   History reviewed. No pertinent family history. Past Surgical History:  Procedure Laterality Date   ABDOMINAL SURGERY       Mardella Layman, MD 03/16/23 [phone]

## 2023-03-16 NOTE — ED Triage Notes (Signed)
Patient c/o headache and generalized body aches x 2 days.  Patient denies taking any medication for his symptoms.

## 2023-03-16 NOTE — Discharge Instructions (Signed)
You have been tested for COVID-19 today. °If your test returns positive, you will receive a phone call from [facility] regarding your results. °Negative test results are not called. °Both positive and negative results area always visible on MyChart. °If you do not have a MyChart account, sign up instructions are provided in your discharge papers. °Please do not hesitate to contact us should you have questions or concerns. ° °

## 2023-03-17 LAB — SARS CORONAVIRUS 2 (TAT 6-24 HRS): SARS Coronavirus 2: NEGATIVE

## 2023-05-02 ENCOUNTER — Other Ambulatory Visit: Payer: Self-pay

## 2023-05-02 ENCOUNTER — Emergency Department (HOSPITAL_COMMUNITY): Payer: 59

## 2023-05-02 ENCOUNTER — Emergency Department (HOSPITAL_COMMUNITY)
Admission: EM | Admit: 2023-05-02 | Discharge: 2023-05-02 | Disposition: A | Discharge status: Home / Self Care | Payer: 59

## 2023-05-02 ENCOUNTER — Encounter (HOSPITAL_COMMUNITY): Payer: Self-pay

## 2023-05-02 DIAGNOSIS — S8700XA Crushing injury of unspecified knee, initial encounter: Secondary | ICD-10-CM | POA: Diagnosis not present

## 2023-05-02 DIAGNOSIS — Y92410 Unspecified street and highway as the place of occurrence of the external cause: Secondary | ICD-10-CM | POA: Diagnosis not present

## 2023-05-02 DIAGNOSIS — M25571 Pain in right ankle and joints of right foot: Secondary | ICD-10-CM | POA: Diagnosis not present

## 2023-05-02 DIAGNOSIS — S8001XA Contusion of right knee, initial encounter: Secondary | ICD-10-CM | POA: Diagnosis not present

## 2023-05-02 MED ORDER — NAPROXEN 500 MG PO TABS: 500.0000 mg | ORAL_TABLET | Freq: Two times a day (BID) | ORAL | 0 refills | Status: DC

## 2023-05-02 MED ORDER — HYDROCODONE-ACETAMINOPHEN 5-325 MG PO TABS
1.0000 | ORAL_TABLET | Freq: Once | ORAL | Status: AC
  Administered 2023-05-02: 1 via ORAL
  Filled 2023-05-02: qty 1

## 2023-05-02 NOTE — Progress Notes (Signed)
Orthopedic Tech Progress Note Patient Details:  Phillip Barnett Nov 27, 1975 [id number]  Ortho Devices Type of Ortho Device: Knee Immobilizer Ortho Device/Splint Location: RLE Ortho Device/Splint Interventions: Ordered, Application, Adjustment   Post Interventions Patient Tolerated: Well Instructions Provided: Care of device, Adjustment of device Patient unhappy with hinged knee sleeve, provided a knee immobilizer instead, but patient is still dissatisfied and stating "everyone here has an attitude and I'm suing." [place] A Gerilyn Pilgrim 05/02/2023, 8:54 PM

## 2023-05-02 NOTE — ED Notes (Signed)
Pt standing at door, stating that this "thing is funny, I think yall are are trying to insult me with this".
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
## 2023-05-05 ENCOUNTER — Encounter (HOSPITAL_COMMUNITY): Payer: Self-pay | Admitting: Emergency Medicine

## 2023-05-05 ENCOUNTER — Emergency Department (HOSPITAL_COMMUNITY)
Admission: EM | Admit: 2023-05-05 | Discharge: 2023-05-05 | Disposition: A | Discharge status: Home / Self Care | Payer: 59 | Attending: Emergency Medicine | Admitting: Emergency Medicine

## 2023-05-05 ENCOUNTER — Other Ambulatory Visit: Payer: Self-pay

## 2023-05-05 ENCOUNTER — Emergency Department (HOSPITAL_COMMUNITY): Payer: 59

## 2023-05-05 DIAGNOSIS — M545 Low back pain, unspecified: Secondary | ICD-10-CM | POA: Diagnosis not present

## 2023-05-05 DIAGNOSIS — M25561 Pain in right knee: Secondary | ICD-10-CM | POA: Diagnosis not present

## 2023-05-05 DIAGNOSIS — Y92410 Unspecified street and highway as the place of occurrence of the external cause: Secondary | ICD-10-CM | POA: Diagnosis not present

## 2023-05-05 DIAGNOSIS — M549 Dorsalgia, unspecified: Secondary | ICD-10-CM | POA: Insufficient documentation

## 2023-05-05 DIAGNOSIS — M5136 Other intervertebral disc degeneration, lumbar region with discogenic back pain only: Secondary | ICD-10-CM | POA: Diagnosis not present

## 2023-05-05 MED ORDER — HYDROCODONE-ACETAMINOPHEN 5-325 MG PO TABS
1.0000 | ORAL_TABLET | Freq: Once | ORAL | Status: AC
  Administered 2023-05-05: 1 via ORAL
  Filled 2023-05-05: qty 1

## 2023-05-05 NOTE — Discharge Instructions (Signed)
Phillip Barnett:  Thank you for allowing [place] to take care of you today.  We hope you begin feeling better soon. You were seen today for back pain.  X-rays of your back were unremarkable, which is good news.  There is no sign of traumatic fracture.  To-Do: Please follow-up with your primary doctor to schedule an appointment with a new primary care doctor within the next 2-3 days. Pick up your prescription for naproxen as previously prescribed.  You may take this as needed for pain management.  Please return to the Emergency Department or call 911 if you experience worsening back pain, numbness, tingling, weakness, chest pain, shortness of breath, severe pain, severe fever, altered mental status, or have any reason to think that you need emergency medical care.  Thank you again.  Hope you feel better soon.
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
## 2023-05-05 NOTE — ED Notes (Signed)
Patient transported to X-ray 
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
## 2023-05-05 NOTE — ED Notes (Signed)
Beverage supplied to pt

## 2023-06-09 ENCOUNTER — Emergency Department (HOSPITAL_COMMUNITY): Payer: 59

## 2023-06-09 ENCOUNTER — Emergency Department (HOSPITAL_COMMUNITY)
Admission: EM | Admit: 2023-06-09 | Discharge: 2023-06-09 | Disposition: A | Discharge status: Home / Self Care | Payer: 59 | Attending: Emergency Medicine | Admitting: Emergency Medicine

## 2023-06-09 ENCOUNTER — Other Ambulatory Visit: Payer: Self-pay

## 2023-06-09 DIAGNOSIS — Z79899 Other long term (current) drug therapy: Secondary | ICD-10-CM | POA: Insufficient documentation

## 2023-06-09 DIAGNOSIS — Y905 Blood alcohol level of 100-119 mg/100 ml: Secondary | ICD-10-CM | POA: Diagnosis not present

## 2023-06-09 DIAGNOSIS — R4182 Altered mental status, unspecified: Secondary | ICD-10-CM | POA: Diagnosis not present

## 2023-06-09 DIAGNOSIS — F1910 Other psychoactive substance abuse, uncomplicated: Secondary | ICD-10-CM | POA: Diagnosis not present

## 2023-06-09 DIAGNOSIS — F10920 Alcohol use, unspecified with intoxication, uncomplicated: Secondary | ICD-10-CM | POA: Diagnosis not present

## 2023-06-09 DIAGNOSIS — F1990 Other psychoactive substance use, unspecified, uncomplicated: Secondary | ICD-10-CM

## 2023-06-09 LAB — CBC WITH DIFFERENTIAL/PLATELET
Abs Immature Granulocytes: 0.22 10*3/uL — ABNORMAL HIGH (ref 0.00–0.07)
Basophils Absolute: 0.1 10*3/uL (ref 0.0–0.1)
Basophils Relative: 1 %
Eosinophils Absolute: 0.3 10*3/uL (ref 0.0–0.5)
Eosinophils Relative: 2 %
HCT: 48.8 % (ref 39.0–52.0)
Hemoglobin: 15.3 g/dL (ref 13.0–17.0)
Immature Granulocytes: 2 %
Lymphocytes Relative: 27 %
Lymphs Abs: 4.1 10*3/uL — ABNORMAL HIGH (ref 0.7–4.0)
MCH: 28.5 pg (ref 26.0–34.0)
MCHC: 31.4 g/dL (ref 30.0–36.0)
MCV: 90.9 fL (ref 80.0–100.0)
Monocytes Absolute: 1.6 10*3/uL — ABNORMAL HIGH (ref 0.1–1.0)
Monocytes Relative: 10 %
Neutro Abs: 8.9 10*3/uL — ABNORMAL HIGH (ref 1.7–7.7)
Neutrophils Relative %: 58 %
Platelets: 252 10*3/uL (ref 150–400)
RBC: 5.37 MIL/uL (ref 4.22–5.81)
RDW: 13.1 % (ref 11.5–15.5)
WBC: 15.1 10*3/uL — ABNORMAL HIGH (ref 4.0–10.5)
nRBC: 0 % (ref 0.0–0.2)

## 2023-06-09 LAB — I-STAT CHEM 8, ED
BUN: 16 mg/dL (ref 6–20)
Calcium, Ion: 1.04 mmol/L — ABNORMAL LOW (ref 1.15–1.40)
Chloride: 101 mmol/L (ref 98–111)
Creatinine, Ser: 2 mg/dL — ABNORMAL HIGH (ref 0.61–1.24)
Glucose, Bld: 327 mg/dL — ABNORMAL HIGH (ref 70–99)
HCT: 49 % (ref 39.0–52.0)
Hemoglobin: 16.7 g/dL (ref 13.0–17.0)
Potassium: 2.9 mmol/L — ABNORMAL LOW (ref 3.5–5.1)
Sodium: 137 mmol/L (ref 135–145)
TCO2: 22 mmol/L (ref 22–32)

## 2023-06-09 LAB — COMPREHENSIVE METABOLIC PANEL
ALT: 17 U/L (ref 0–44)
AST: 26 U/L (ref 15–41)
Albumin: 3.1 g/dL — ABNORMAL LOW (ref 3.5–5.0)
Alkaline Phosphatase: 57 U/L (ref 38–126)
Anion gap: 15 (ref 5–15)
BUN: 15 mg/dL (ref 6–20)
CO2: 17 mmol/L — ABNORMAL LOW (ref 22–32)
Calcium: 8.2 mg/dL — ABNORMAL LOW (ref 8.9–10.3)
Chloride: 101 mmol/L (ref 98–111)
Creatinine, Ser: 1.88 mg/dL — ABNORMAL HIGH (ref 0.61–1.24)
GFR, Estimated: 24 mL/min — ABNORMAL LOW (ref >60)
Glucose, Bld: 329 mg/dL — ABNORMAL HIGH (ref 70–99)
Potassium: 3 mmol/L — ABNORMAL LOW (ref 3.5–5.1)
Sodium: 133 mmol/L — ABNORMAL LOW (ref 135–145)
Total Bilirubin: 0.4 mg/dL (ref <1.2)
Total Protein: 6.5 g/dL (ref 6.5–8.1)

## 2023-06-09 LAB — I-STAT VENOUS BLOOD GAS, ED
Acid-base deficit: 12 mmol/L — ABNORMAL HIGH (ref 0.0–2.0)
Bicarbonate: 20 mmol/L (ref 20.0–28.0)
Calcium, Ion: 1.03 mmol/L — ABNORMAL LOW (ref 1.15–1.40)
HCT: 50 % (ref 39.0–52.0)
Hemoglobin: 17 g/dL (ref 13.0–17.0)
O2 Saturation: 93 %
Potassium: 2.9 mmol/L — ABNORMAL LOW (ref 3.5–5.1)
Sodium: 137 mmol/L (ref 135–145)
TCO2: 22 mmol/L (ref 22–32)
pCO2, Ven: 72.6 mm[Hg] (ref 44–60)
pH, Ven: 7.047 — CL (ref 7.25–7.43)
pO2, Ven: 98 mm[Hg] — ABNORMAL HIGH (ref 32–45)

## 2023-06-09 LAB — SALICYLATE LEVEL: Salicylate Lvl: <7 mg/dL — ABNORMAL LOW (ref 7.0–30.0)

## 2023-06-09 LAB — I-STAT CG4 LACTIC ACID, ED
Lactic Acid, Venous: 7.1 mmol/L (ref 0.5–1.9)
Lactic Acid, Venous: 2.7 mmol/L (ref 0.5–1.9)

## 2023-06-09 LAB — RAPID URINE DRUG SCREEN, HOSP PERFORMED
Amphetamines: NOT DETECTED
Barbiturates: NOT DETECTED
Benzodiazepines: NOT DETECTED
Cocaine: NOT DETECTED
Opiates: POSITIVE — AB
Tetrahydrocannabinol: POSITIVE — AB

## 2023-06-09 LAB — ETHANOL: Alcohol, Ethyl (B): 106 mg/dL — ABNORMAL HIGH (ref <10)

## 2023-06-09 LAB — ACETAMINOPHEN LEVEL: Acetaminophen (Tylenol), Serum: <10 ug/mL — ABNORMAL LOW (ref 10–30)

## 2023-06-09 LAB — CK: Total CK: 117 U/L (ref 49–397)

## 2023-06-09 LAB — CBG MONITORING, ED: Glucose-Capillary: 308 mg/dL — ABNORMAL HIGH (ref 70–99)

## 2023-06-09 MED ORDER — ONDANSETRON HCL 4 MG/2ML IJ SOLN
4.0000 mg | Freq: Once | INTRAMUSCULAR | Status: AC
  Administered 2023-06-09: 4 mg via INTRAVENOUS
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
## 2023-06-09 NOTE — ED Notes (Signed)
Narcan given
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
## 2023-06-09 NOTE — Discharge Instructions (Addendum)
Refrain from using illicit substances, they are bad for your health. Please follow-up with primary care.  If you do not have one, can call to be seen at the wellness clinic. Return here for new concerns.

## 2023-06-09 NOTE — ED Notes (Signed)
4mg Zofran given

## 2023-06-09 NOTE — ED Notes (Signed)
Pt now alert/responding. He states "where am I at? [facility]?".

## 2023-06-09 NOTE — ED Notes (Signed)
Pt being bagged at this time. Pt unresponsive.

## 2023-09-22 ENCOUNTER — Other Ambulatory Visit (HOSPITAL_COMMUNITY): Payer: Self-pay

## 2023-09-22 ENCOUNTER — Telehealth: Payer: Self-pay

## 2023-09-22 NOTE — Telephone Encounter (Addendum)
Pharmacy Patient Advocate Encounter  Insurance verification completed.   The patient is insured through [facility]  & Rx [employer] [place]  Ran test claim for Biktarvy. Currently a quantity of 30 is a 30 day supply and the co-pay is $0.00 .  Cabenuva may be covered under medical benefits. We will need to see a copy of the insurance card.  This test claim was processed through [facility]- copay amounts may vary at other pharmacies due to pharmacy/plan contracts, or as the patient moves through the different stages of their insurance plan.

## 2023-09-28 ENCOUNTER — Ambulatory Visit (INDEPENDENT_AMBULATORY_CARE_PROVIDER_SITE_OTHER): Payer: 59 | Admitting: Physician Assistant

## 2023-09-28 ENCOUNTER — Ambulatory Visit: Payer: 59

## 2023-09-28 ENCOUNTER — Other Ambulatory Visit: Payer: Self-pay

## 2023-09-28 ENCOUNTER — Other Ambulatory Visit (HOSPITAL_COMMUNITY)
Admission: RE | Admit: 2023-09-28 | Discharge: 2023-09-28 | Disposition: A | Discharge status: Home / Self Care | Payer: 59 | Source: Ambulatory Visit | Attending: Physician Assistant | Admitting: Physician Assistant

## 2023-09-28 ENCOUNTER — Ambulatory Visit (INDEPENDENT_AMBULATORY_CARE_PROVIDER_SITE_OTHER): Payer: 59 | Admitting: Pharmacist

## 2023-09-28 ENCOUNTER — Ambulatory Visit: Payer: 59 | Admitting: Pharmacist

## 2023-09-28 ENCOUNTER — Encounter: Payer: Self-pay | Admitting: Physician Assistant

## 2023-09-28 VITALS — BP 124/83 | HR 84 | Resp 16 | Ht 74.0 in | Wt 180.0 lb

## 2023-09-28 DIAGNOSIS — Z113 Encounter for screening for infections with a predominantly sexual mode of transmission: Secondary | ICD-10-CM

## 2023-09-28 DIAGNOSIS — Z21 Asymptomatic human immunodeficiency virus [HIV] infection status: Secondary | ICD-10-CM | POA: Insufficient documentation

## 2023-09-28 DIAGNOSIS — B20 Human immunodeficiency virus [HIV] disease: Secondary | ICD-10-CM

## 2023-09-28 MED ORDER — BICTEGRAVIR-EMTRICITAB-TENOFOV 50-200-25 MG PO TABS
1.0000 | ORAL_TABLET | Freq: Every day | ORAL | 2 refills | Status: DC
Start: 2023-09-28 — End: 2023-09-28

## 2023-09-28 MED ORDER — BICTEGRAVIR-EMTRICITAB-TENOFOV 50-200-25 MG PO TABS
1.0000 | ORAL_TABLET | Freq: Every day | ORAL | 2 refills | Status: DC
Start: 2023-09-28 — End: 2023-12-15
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
## 2023-09-28 NOTE — Progress Notes (Unsigned)
   09/28/2023  HPI: Phillip Barnett is a 48 y.o. male who presents to the [facility] clinic today to initiate care for a newly diagnosed HIV infection.  There are no active problems to display for this patient.   Patient's Medications  New Prescriptions   No medications on file  Previous Medications   IBUPROFEN (ADVIL) 800 MG TABLET    Take 1 tablet (800 mg total) by mouth 3 (three) times daily with meals.   NAPROXEN (NAPROSYN) 500 MG TABLET    Take 1 tablet (500 mg total) by mouth 2 (two) times daily.  Modified Medications   No medications on file  Discontinued Medications   No medications on file    Labs: No results found for: "HIV1RNAQUANT", "HIV1RNAVL", "CD4TABS"  RPR and STI No results found for: "LABRPR", "RPRTITER"      No data to display          Hepatitis B No results found for: "HEPBSAB", "HEPBSAG", "HEPBCAB" Hepatitis C No results found for: "HEPCAB", "HCVRNAPCRQN" Hepatitis A No results found for: "HAV" Lipids: No results found for: "CHOL", "TRIG", "HDL", "CHOLHDL", "VLDL", "LDLCALC"  Current HIV Regimen: Treatment naive  Assessment: Phillip Barnett is here today to initiate care for his newly diagnosed HIV infection. He was first seen today by Arvilla Meres, PA-C. I counseled the patient today on Biktarvy. He understands to take Biktarvy once a day with or without food. We discussed the importance of adherence and showing up to appointments. We discussed that missing doses can put him at greater risk for resistance. We discussed potential side effects, especially when first starting the medication. He understands to separate any supplements from [place]ia and to let us know if he starts any new medications. All questions were answered.  Plan: - Start Biktarvy - Follow up scheduled for 11/09/2023 with Kenney Houseman, PharmD [facility] 09/28/2023, 7:49 AM

## 2023-09-28 NOTE — Patient Instructions (Addendum)
 Nice meeting you today Phillip Barnett! Today we are going to start [place], this medication is to be taken once daily at the same time with or without food.  Avoid sexual intercourse until virus is suppressed. I will call with all labs results, typically the viral load and genotype will take up to 10 days to return.   In the meantime, avoid handling raw meat, gardening, avoid ill individuals until virus is suppressed and we have a reading on your immune system. Dental referral place Immunizations at next visit Labs today Resources provided [facility] Food finder app Primary care referral Housing referral

## 2023-09-28 NOTE — Patient Instructions (Addendum)
 For hot lunches. [facility] Address: [address] [place], [place], [place] [address] Phone: [phone]  Dental: [phone], ask for Sheilah Pigeon with [facility] should be reaching out about housing, you can also reach her at [phone]  [facility] [employer] App  Please leave me a voicemail if you're going to be in custody so that we can get your medication sent over: [phone]  Primary care: here are a few options [phone]  [facility] Primary Care: [email or web address]  Please call Family Services if you decide to seek counseling at [phone], we can also get you set up for an appointment with our counselor here to get you started  Sine you have Medicaid, they can assist with transportation: [phone] (they typically need a 3-day heads up to get everything squared away)  If you're interested in case management, call [facility] at [phone]
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
## 2023-09-29 LAB — URINE CYTOLOGY ANCILLARY ONLY
Chlamydia: NEGATIVE
Comment: NEGATIVE
Comment: NORMAL
Neisseria Gonorrhea: NEGATIVE

## 2023-09-29 LAB — URINALYSIS
Bilirubin Urine: NEGATIVE
Glucose, UA: NEGATIVE
Hgb urine dipstick: NEGATIVE
Ketones, ur: NEGATIVE
Leukocytes,Ua: NEGATIVE
Nitrite: NEGATIVE
Protein, ur: NEGATIVE
Specific Gravity, Urine: 1.001 (ref 1.001–1.035)
pH: 6.5 (ref 5.0–8.0)

## 2023-09-29 LAB — CYTOLOGY, (ORAL, ANAL, URETHRAL) ANCILLARY ONLY
Chlamydia: NEGATIVE
Comment: NEGATIVE
Comment: NORMAL
Neisseria Gonorrhea: NEGATIVE

## 2023-10-06 ENCOUNTER — Telehealth: Payer: Self-pay

## 2023-10-06 NOTE — Telephone Encounter (Signed)
 Called preferred number, does not belong to [facility]. They provided alternate contact info for him [phone]. That number is not in service. Appears patient's number is actually [phone]. [facility] prefers the clinic call this number first. Preference updated in chart.   Discussed that viral load was 104,000, but that this should decline quickly with Biktarvy. Relayed that CD4 was not ordered at this visit, but can be completed when he comes in next month. He denies any issues with taking his Biktarvy.  Discussed that he does not have immunity to Hep A or Hep B and that [name] recommends vaccination. Offered nurse appointment, he would prefer to defer until he sees [facility] in April. Relayed all other labs within normal range. Patient verbalized understanding and has no further questions.    Sandie Ano, RN

## 2023-10-06 NOTE — Telephone Encounter (Signed)
-----   Message from Arvilla Meres sent at 10/06/2023 11:51 AM EST ----- I cannot find Phillip Barnett's CD4.  Please let him know his viral load is 104,000 He will need hepatitis A and B vaccine.  All other labs appear within normal range.

## 2023-10-07 ENCOUNTER — Other Ambulatory Visit: Payer: Self-pay | Admitting: Pharmacist

## 2023-10-07 MED ORDER — BICTEGRAVIR-EMTRICITAB-TENOFOV 50-200-25 MG PO TABS
1.0000 | ORAL_TABLET | Freq: Every day | ORAL | Status: AC
Start: 2023-09-30 — End: 2023-10-07

## 2023-10-07 NOTE — Progress Notes (Signed)
 Medication Samples have been provided to the patient.  Drug name: Biktarvy        Strength: 50/200/25 mg       Qty: 7 tablets (1 bottles) LOT: [id number]   Exp.Date: 6/27  Dosing instructions: Take one tablet by mouth once daily  The patient has been instructed regarding the correct time, dose, and frequency of taking this medication, including desired effects and most common side effects.   Margarite Gouge, PharmD, CPP, BCIDP, AAHIVP Clinical Pharmacist Practitioner Infectious Diseases Clinical Pharmacist [facility]

## 2023-10-10 LAB — LIPID PANEL
Cholesterol: 136 mg/dL (ref <200)
HDL: 43 mg/dL (ref >=40)
LDL Cholesterol (Calc): 70 mg/dL
Non-HDL Cholesterol (Calc): 93 mg/dL (ref <130)
Total CHOL/HDL Ratio: 3.2 (calc) (ref <5.0)
Triglycerides: 143 mg/dL (ref <150)

## 2023-10-10 LAB — COMPLETE METABOLIC PANEL WITH GFR
AG Ratio: 1.1 (calc) (ref 1.0–2.5)
ALT: 14 U/L (ref 9–46)
AST: 13 U/L (ref 10–40)
Albumin: 4.1 g/dL (ref 3.6–5.1)
Alkaline phosphatase (APISO): 68 U/L (ref 36–130)
BUN: 11 mg/dL (ref 7–25)
CO2: 32 mmol/L (ref 20–32)
Calcium: 9.4 mg/dL (ref 8.6–10.3)
Chloride: 102 mmol/L (ref 98–110)
Creat: 1.04 mg/dL (ref 0.60–1.29)
Globulin: 3.7 g/dL (ref 1.9–3.7)
Glucose, Bld: 80 mg/dL (ref 65–99)
Potassium: 4.6 mmol/L (ref 3.5–5.3)
Sodium: 137 mmol/L (ref 135–146)
Total Bilirubin: 0.6 mg/dL (ref 0.2–1.2)
Total Protein: 7.8 g/dL (ref 6.1–8.1)
eGFR: 89 mL/min/{1.73_m2} (ref >=60)

## 2023-10-10 LAB — CBC WITH DIFFERENTIAL/PLATELET
Absolute Lymphocytes: 824 {cells}/uL — ABNORMAL LOW (ref 850–3900)
Absolute Monocytes: 597 {cells}/uL (ref 200–950)
Basophils Absolute: 41 {cells}/uL (ref 0–200)
Basophils Relative: 0.7 %
Eosinophils Absolute: 99 {cells}/uL (ref 15–500)
Eosinophils Relative: 1.7 %
HCT: 47.4 % (ref 38.5–50.0)
Hemoglobin: 15.2 g/dL (ref 13.2–17.1)
MCH: 28.5 pg (ref 27.0–33.0)
MCHC: 32.1 g/dL (ref 32.0–36.0)
MCV: 88.9 fL (ref 80.0–100.0)
MPV: 10.3 fL (ref 7.5–12.5)
Monocytes Relative: 10.3 %
Neutro Abs: 4240 {cells}/uL (ref 1500–7800)
Neutrophils Relative %: 73.1 %
Platelets: 221 10*3/uL (ref 140–400)
RBC: 5.33 10*6/uL (ref 4.20–5.80)
RDW: 12.8 % (ref 11.0–15.0)
Total Lymphocyte: 14.2 %
WBC: 5.8 10*3/uL (ref 3.8–10.8)

## 2023-10-10 LAB — HEPATITIS B SURFACE ANTIGEN: Hepatitis B Surface Ag: NONREACTIVE

## 2023-10-10 LAB — QUANTIFERON-TB GOLD PLUS
Mitogen-NIL: 1.34 [IU]/mL
NIL: 0.02 [IU]/mL
QuantiFERON-TB Gold Plus: NEGATIVE
TB1-NIL: 0.01 [IU]/mL
TB2-NIL: 0.01 [IU]/mL

## 2023-10-10 LAB — HIV-1/2 AB - DIFFERENTIATION
HIV-1 antibody: POSITIVE — AB
HIV-2 Ab: NEGATIVE

## 2023-10-10 LAB — HIV ANTIBODY (ROUTINE TESTING W REFLEX): HIV 1&2 Ab, 4th Generation: REACTIVE — AB

## 2023-10-10 LAB — CRYPTOCOCCAL AG, LTX SCR RFLX TITER
Cryptococcal Ag Screen: NOT DETECTED
MICRO NUMBER:: 16133624
SPECIMEN QUALITY:: ADEQUATE

## 2023-10-10 LAB — HEPATITIS C ANTIBODY: Hepatitis C Ab: NONREACTIVE

## 2023-10-10 LAB — HEPATITIS B CORE ANTIBODY, TOTAL: Hep B Core Total Ab: NONREACTIVE

## 2023-10-10 LAB — HEPATITIS B SURFACE ANTIBODY,QUALITATIVE: Hep B S Ab: NONREACTIVE

## 2023-10-10 LAB — HIV-1 RNA ULTRAQUANT REFLEX TO GENTYP+
HIV 1 RNA Quant: 104000 {copies}/mL — ABNORMAL HIGH
HIV-1 RNA Quant, Log: 5.02 {Log_copies}/mL — ABNORMAL HIGH

## 2023-10-10 LAB — HLA B*5701: HLA-B*5701 w/rflx HLA-B High: NEGATIVE

## 2023-10-10 LAB — RPR: RPR Ser Ql: NONREACTIVE

## 2023-10-10 LAB — HEPATITIS A ANTIBODY, TOTAL: Hepatitis A AB,Total: NONREACTIVE

## 2023-10-10 LAB — HIV-1 GENOTYPE: HIV-1 Genotype: DETECTED — AB

## 2023-10-25 ENCOUNTER — Telehealth: Payer: Self-pay

## 2023-10-25 NOTE — Telephone Encounter (Signed)
 Received call from [facility] LPN with nurse services requesting medication information. Provided her with Biktarvy dose and directions. No additional information needed at this time.  Juanita Laster, RMA

## 2023-10-25 NOTE — Telephone Encounter (Signed)
 Phillip Barnett called needing assistance with refilling his Biktarvy. He is currently at [name]. Called [facility] with [name] on the line, they will get his refill ready today and [name] will have someone from [name] pick it up for him.   Spent time discussing that since he has Medicaid he cannot enroll in auto-refill and will need to call the pharmacy each month. Informed [name] that he can call the number on his medication bottle and go through the automated phone system or the app to avoid holding on the phone. Encouraged him to request a refill when he gets down to 5-7 pills left.   He would like to let [name] know that he's doing well and in good spirits.   Sandie Ano, RN

## 2023-11-09 ENCOUNTER — Ambulatory Visit: Payer: 59 | Admitting: Physician Assistant

## 2023-11-28 ENCOUNTER — Encounter: Payer: Self-pay | Admitting: Physician Assistant

## 2023-11-28 ENCOUNTER — Other Ambulatory Visit: Payer: Self-pay

## 2023-11-28 ENCOUNTER — Ambulatory Visit (INDEPENDENT_AMBULATORY_CARE_PROVIDER_SITE_OTHER): Admitting: Physician Assistant

## 2023-11-28 VITALS — BP 130/88 | HR 97 | Temp 99.4°F | Resp 16 | Ht 75.0 in | Wt 179.2 lb

## 2023-11-28 DIAGNOSIS — Z21 Asymptomatic human immunodeficiency virus [HIV] infection status: Secondary | ICD-10-CM | POA: Diagnosis not present

## 2023-11-28 DIAGNOSIS — F1910 Other psychoactive substance abuse, uncomplicated: Secondary | ICD-10-CM | POA: Insufficient documentation

## 2023-11-28 NOTE — Progress Notes (Signed)
 Subjective:    Patient ID: Phillip Barnett, male    DOB: April 08, 1976, 48 y.o.   MRN: [id number]  Chief Complaint  Patient presents with   Follow-up    B20      HPI:  Phillip Barnett is a 48 y.o. male presents today to follow up for HIV-1.  Is more immediate need is requesting admission to Detox facility for his methamphetamine, crack cocaine, THC and alcohol addiction. He states he had a really bad "high yesterday" and feels he needs to go to rehabilitation before he "crashes out and buys more drugs." Onset of drug use age 76. He eats  meth, smoking crack, drinking approximately 10 beers per day, and using THC.  He has not had a meal since 11/24/23.  He has not slept.  He feels a mild headache currently and really just wants to "lay down." He entered [facility] early April and left without completing the program which he regrets.   He has been taking his biktarvy daily per patient, tolerated well.     Allergies  Allergen Reactions   Sulfa Antibiotics     Unknown      Outpatient Medications Prior to Visit  Medication Sig Dispense Refill   bictegravir-emtricitabine-tenofovir AF (BIKTARVY) 50-200-25 MG TABS tablet Take 1 tablet by mouth daily. Try to take at the same time each day with or without food. 30 tablet 2   No facility-administered medications prior to visit.     Past Medical History:  Diagnosis Date   Alcohol abuse    Alcoholic pancreatitis      Past Surgical History:  Procedure Laterality Date   ABDOMINAL SURGERY         Review of Systems  Constitutional:  Positive for appetite change and fatigue. Negative for diaphoresis and fever.  HENT:  Negative for mouth sores and sore throat.   Eyes:  Negative for visual disturbance.  Respiratory:  Negative for cough, shortness of breath and wheezing.   Cardiovascular:  Negative for chest pain, palpitations and leg swelling.  Gastrointestinal:  Negative for abdominal pain, blood in stool, diarrhea, nausea and vomiting.   Genitourinary: Negative.   Musculoskeletal:  Positive for joint swelling. Negative for myalgias.  Skin:  Negative for rash.  Allergic/Immunologic: Positive for immunocompromised state.  Neurological:  Positive for light-headedness and headaches. Negative for dizziness, tremors and weakness.  Hematological:  Negative for adenopathy.  Psychiatric/Behavioral:  Negative for agitation, behavioral problems, confusion, decreased concentration, dysphoric mood, hallucinations and suicidal ideas. The patient is nervous/anxious.       Objective:    BP 130/88   Pulse 97   Temp 99.4 F (37.4 C) (Oral)   Resp 16   Ht 6\' 3"  (1.905 m)   Wt 179 lb 3.2 oz (81.3 kg)   SpO2 98%   BMI 22.40 kg/m  Nursing note and vital signs reviewed.  Physical Exam Vitals reviewed.  Constitutional:      General: He is not in acute distress.    Appearance: Normal appearance. He is normal weight. He is not ill-appearing, toxic-appearing or diaphoretic.  HENT:     Head: Normocephalic and atraumatic.  Cardiovascular:     Rate and Rhythm: Normal rate and regular rhythm.     Pulses: Normal pulses.     Heart sounds: Normal heart sounds.  Pulmonary:     Effort: Pulmonary effort is normal.     Breath sounds: Normal breath sounds.  Abdominal:     General: Abdomen is flat. Bowel  sounds are normal.     Palpations: Abdomen is soft.  Musculoskeletal:        General: Normal range of motion.     Cervical back: Normal range of motion and neck supple.  Skin:    General: Skin is warm and dry.     Findings: No lesion or rash.  Neurological:     Mental Status: He is alert.  Psychiatric:        Mood and Affect: Mood normal.        Behavior: Behavior normal.        Thought Content: Thought content normal.        Judgment: Judgment normal.         11/28/2023   12:53 PM 09/28/2023    1:24 PM  Depression screen PHQ 2/9  Decreased Interest 2 0  Down, Depressed, Hopeless 2 0  PHQ - 2 Score 4 0  Altered sleeping 2    Tired, decreased energy 2   Change in appetite 2   Feeling bad or failure about yourself  2   Trouble concentrating 2   Moving slowly or fidgety/restless 2   Suicidal thoughts 2   PHQ-9 Score 18   Difficult doing work/chores Very difficult        Assessment & Plan:  HIV-1: biktarvy daily tolerating well, adherent per pt hx, I have ordered VL, CD4 and CMP Polysubstance abuse: requesting admission to detox TODAY, setting up referral to [facility] for inpatient stay to Detox crack, meth, alcohol. Mentally has PHQ9 score of 18 he does deny suicidal thoughts ideas, intent or plans when questioned further.   Patient Active Problem List   Diagnosis Date Noted   Polysubstance abuse (HCC) 11/28/2023   Asymptomatic HIV infection (HCC) 09/28/2023   Adenopathy 07/25/2018   Chronic abdominal pain 07/25/2018   Chronic pancreatitis (HCC) 07/05/2018     Problem List Items Addressed This Visit     Asymptomatic HIV infection (HCC) - Primary   Relevant Orders   T-helper cells (CD4) count (not at [facility])   HIV-1 RNA quant-no reflex-bld   COMPLETE METABOLIC PANEL WITHOUT GFR   Polysubstance abuse (HCC)     I am having Phillip Barnett maintain his bictegravir-emtricitabine-tenofovir AF.   No orders of the defined types were placed in this encounter.    Follow-up: Return in about 4 weeks (around 12/26/2023) for B20.

## 2023-11-28 NOTE — Patient Instructions (Signed)
 Referral to [facility] Follow up in 4 weeks

## 2023-11-30 ENCOUNTER — Telehealth: Payer: Self-pay

## 2023-11-30 LAB — COMPLETE METABOLIC PANEL WITHOUT GFR
AG Ratio: 1.2 (calc) (ref 1.0–2.5)
ALT: 11 U/L (ref 9–46)
AST: 18 U/L (ref 10–40)
Albumin: 4.7 g/dL (ref 3.6–5.1)
Alkaline phosphatase (APISO): 71 U/L (ref 36–130)
BUN: 11 mg/dL (ref 7–25)
CO2: 26 mmol/L (ref 20–32)
Calcium: 9.7 mg/dL (ref 8.6–10.3)
Chloride: 100 mmol/L (ref 98–110)
Creat: 1.1 mg/dL (ref 0.60–1.29)
Globulin: 3.8 g/dL — ABNORMAL HIGH (ref 1.9–3.7)
Glucose, Bld: 80 mg/dL (ref 65–99)
Potassium: 4.2 mmol/L (ref 3.5–5.3)
Sodium: 135 mmol/L (ref 135–146)
Total Bilirubin: 0.7 mg/dL (ref 0.2–1.2)
Total Protein: 8.5 g/dL — ABNORMAL HIGH (ref 6.1–8.1)

## 2023-11-30 LAB — T-HELPER CELLS (CD4) COUNT (NOT AT ARMC)
Absolute CD4: 329 {cells}/uL — ABNORMAL LOW (ref 490–1740)
CD4 T Helper %: 37 % (ref 30–61)
Total lymphocyte count: 896 {cells}/uL (ref 850–3900)

## 2023-11-30 LAB — HIV-1 RNA QUANT-NO REFLEX-BLD
HIV 1 RNA Quant: <20 {copies}/mL — AB
HIV-1 RNA Quant, Log: <1.3 {Log_copies}/mL — AB

## 2023-11-30 NOTE — Telephone Encounter (Signed)
-----   Message from Phillip Barnett sent at 11/30/2023 10:30 AM EDT ----- Sylvester Evert your viral load is already undetectable good work, you CD4 count is risking to 329.

## 2023-11-30 NOTE — Telephone Encounter (Signed)
Attempted to contact patient - call couldn't be completed.   [place], CMA

## 2023-12-06 NOTE — Telephone Encounter (Signed)
 Patient returned call. Patient aware of results. Patient stated that he is working on doing better since last appointment, trying to stay grounded and remain positive.   Phillip Barnett, CMA

## 2023-12-15 ENCOUNTER — Other Ambulatory Visit: Payer: Self-pay

## 2023-12-15 DIAGNOSIS — Z21 Asymptomatic human immunodeficiency virus [HIV] infection status: Secondary | ICD-10-CM

## 2023-12-15 MED ORDER — BICTEGRAVIR-EMTRICITAB-TENOFOV 50-200-25 MG PO TABS: 1.0000 | ORAL_TABLET | Freq: Every day | ORAL | 2 refills | Status: DC

## 2023-12-27 ENCOUNTER — Ambulatory Visit: Admitting: Infectious Diseases

## 2024-01-04 ENCOUNTER — Telehealth: Payer: Self-pay

## 2024-01-04 NOTE — Telephone Encounter (Signed)
 ERROR

## 2024-01-16 ENCOUNTER — Encounter: Payer: Self-pay | Admitting: Physician Assistant

## 2024-01-16 ENCOUNTER — Ambulatory Visit: Admitting: Physician Assistant

## 2024-01-16 VITALS — BP 127/88 | HR 69 | Ht 74.0 in | Wt 188.2 lb

## 2024-01-16 DIAGNOSIS — F1910 Other psychoactive substance abuse, uncomplicated: Secondary | ICD-10-CM | POA: Diagnosis not present

## 2024-01-16 DIAGNOSIS — J069 Acute upper respiratory infection, unspecified: Secondary | ICD-10-CM

## 2024-01-16 DIAGNOSIS — Z21 Asymptomatic human immunodeficiency virus [HIV] infection status: Secondary | ICD-10-CM | POA: Diagnosis not present

## 2024-01-16 DIAGNOSIS — J029 Acute pharyngitis, unspecified: Secondary | ICD-10-CM

## 2024-01-16 DIAGNOSIS — K1379 Other lesions of oral mucosa: Secondary | ICD-10-CM

## 2024-01-16 DIAGNOSIS — R110 Nausea: Secondary | ICD-10-CM

## 2024-01-16 MED ORDER — TRIAMCINOLONE ACETONIDE 0.1 % EX CREA: 1.0000 | TOPICAL_CREAM | Freq: Two times a day (BID) | CUTANEOUS | 0 refills | Status: DC

## 2024-01-16 MED ORDER — ONDANSETRON HCL 4 MG PO TABS: 4.0000 mg | ORAL_TABLET | Freq: Three times a day (TID) | ORAL | 0 refills | Status: DC | PRN

## 2024-01-16 MED ORDER — AZITHROMYCIN 250 MG PO TABS: ORAL_TABLET | ORAL | 0 refills | Status: AC

## 2024-01-16 NOTE — Patient Instructions (Signed)
 Please drink 64 to 80 ounces water daily

## 2024-01-16 NOTE — Progress Notes (Signed)
 Patient ID: Phillip Barnett, male   DOB: 1975/12/12, 48 y.o.   MRN: [id number]   Phillip Barnett, is a 48 y.o. male  HYQ:[id number]  [id number]  DOB - Apr 27, 1976  Chief Complaint  Patient presents with   Sinusitis    With sore throat, x7 days. Was given OTC cough syrup and cough drops with little relief.    GI Problem    Denies any constipation or diarrhea.    Dental Pain    Patient has a lot of cavities.        Subjective:   Phillip Barnett is a 48 y.o. male here today for ST and URI s/sx for about 1 week.  Sides of mouth are dry and cracked. +sinus congestion and post nasal drip.  Feels a little nauseated.  No vomiting.  No constipation or diarrhea.  No blood stool  Viral load last drawn end of April 2025 and undetected.  On bitkarvy  No problems updated.  ALLERGIES: Allergies  Allergen Reactions   Sulfa Antibiotics     Unknown    PAST MEDICAL HISTORY: Past Medical History:  Diagnosis Date   Alcohol abuse    Alcoholic pancreatitis    HIV (human immunodeficiency virus infection) (HCC)     MEDICATIONS AT HOME: Prior to Admission medications   Medication Sig Start Date End Date Taking? Authorizing Provider  azithromycin (ZITHROMAX) 250 MG tablet Take 2 tablets on day 1, then 1 tablet daily on days 2 through 5 01/16/24 01/21/24 Yes Jd Mccaster, Stan Eans, PA-C  bictegravir-emtricitabine-tenofovir AF (BIKTARVY) 50-200-25 MG TABS tablet Take 1 tablet by mouth daily. Try to take at the same time each day with or without food. 12/15/23  Yes Jamesetta Mcbride, PA-C  ondansetron  (ZOFRAN ) 4 MG tablet Take 1 tablet (4 mg total) by mouth every 8 (eight) hours as needed for nausea or vomiting. 01/16/24  Yes Hassie Lint, PA-C  triamcinolone cream (KENALOG) 0.1 % Apply 1 Application topically 2 (two) times daily. 01/16/24  Yes Rayya Yagi, Stan Eans, PA-C    ROS: Neg HEENT Neg resp Neg cardiac Neg GU Neg MS Neg psych Neg neuro  Objective:   Vitals:   01/16/24 1417  BP: 127/88  Pulse: 69   Weight: 188 lb 3.2 oz (85.4 kg)  Height: 6' 2 (1.88 m)   Exam General appearance : Awake, alert, not in any distress. Speech Clear. Not toxic looking HEENT: Atraumatic and Normocephalic, mucus membranes dry, throat with mild erythema and PND.  No exudate.  +sinus TTP Neck: Supple, no JVD. No cervical lymphadenopathy.  Chest: Good air entry bilaterally, CTAB.  No rales/rhonchi/wheezing CVS: S1 S2 regular, no murmurs.  Abdomen: Bowel sounds present, Non tender and not distended with no gaurding, rigidity or rebound. Extremities: B/L Lower Ext shows no edema, both legs are warm to touch Neurology: Awake alert, and oriented X 3, CN II-XII intact, Non focal Skin: No Rash  Data Review No results found for: HGBA1C  Assessment & Plan   1. Pharyngitis, unspecified etiology (Primary) Will cover due to sinus congestion and discolored drainage - azithromycin (ZITHROMAX) 250 MG tablet; Take 2 tablets on day 1, then 1 tablet daily on days 2 through 5  Dispense: 6 tablet; Refill: 0  2. Upper respiratory tract infection, unspecified type - azithromycin (ZITHROMAX) 250 MG tablet; Take 2 tablets on day 1, then 1 tablet daily on days 2 through 5  Dispense: 6 tablet; Refill: 0  3. Polysubstance abuse (HCC) Currently in [facility] treatment center-being seen on  mobile unit  4. Asymptomatic HIV infection (HCC) On bitkarvy-count undetectable  5. Mouth sore - triamcinolone cream (KENALOG) 0.1 %; Apply 1 Application topically 2 (two) times daily.  Dispense: 30 g; Refill: 0  6. Nausea - ondansetron  (ZOFRAN ) 4 MG tablet; Take 1 tablet (4 mg total) by mouth every 8 (eight) hours as needed for nausea or vomiting.  Dispense: 20 tablet; Refill: 0    Return if symptoms worsen or fail to improve.  The patient was given clear instructions to go to ER or return to medical center if symptoms don't improve, worsen or new problems develop. The patient verbalized understanding. The patient was told to call to  get lab results if they haven't heard anything in the next week.      Dulce Gibbs, PA-C [facility] [place], [place] [phone]   01/16/2024, 2:46 PM

## 2024-02-02 ENCOUNTER — Ambulatory Visit: Admitting: Infectious Diseases

## 2024-02-02 ENCOUNTER — Telehealth: Payer: Self-pay

## 2024-02-02 NOTE — Telephone Encounter (Signed)
 Phillip Barnett LVM to reschedule his appt. I returned the call but no VM available and no MyChart.

## 2024-02-16 ENCOUNTER — Ambulatory Visit: Admitting: Infectious Diseases

## 2024-02-23 ENCOUNTER — Ambulatory Visit: Admitting: Infectious Diseases

## 2024-02-27 NOTE — Progress Notes (Unsigned)
   HPI: Phillip Barnett is a 48 y.o. male who presents to the [facility] clinic for HIV follow-up.  Patient Active Problem List   Diagnosis Date Noted   Polysubstance abuse (HCC) 11/28/2023   Asymptomatic HIV infection (HCC) 09/28/2023   Adenopathy 07/25/2018   Chronic abdominal pain 07/25/2018   Chronic pancreatitis (HCC) 07/05/2018    Patient's Medications  New Prescriptions   No medications on file  Previous Medications   BICTEGRAVIR-EMTRICITABINE-TENOFOVIR AF (BIKTARVY) 50-200-25 MG TABS TABLET    Take 1 tablet by mouth daily. Try to take at the same time each day with or without food.   ONDANSETRON  (ZOFRAN ) 4 MG TABLET    Take 1 tablet (4 mg total) by mouth every 8 (eight) hours as needed for nausea or vomiting.   TRIAMCINOLONE  CREAM (KENALOG ) 0.1 %    Apply 1 Application topically 2 (two) times daily.  Modified Medications   No medications on file  Discontinued Medications   No medications on file    Labs: Lab Results  Component Value Date   HIV1RNAQUANT <20 DETECTED (A) 11/28/2023   HIV1RNAQUANT 104,000 (H) 09/28/2023    RPR and STI Lab Results  Component Value Date   LABRPR NON-REACTIVE 09/28/2023    STI Results GC CT  09/28/2023  3:20 PM Negative  Negative   09/28/2023  2:34 PM Negative  Negative     Hepatitis B Lab Results  Component Value Date   HEPBSAB NON-REACTIVE 09/28/2023   HEPBSAG NON-REACTIVE 09/28/2023   HEPBCAB NON-REACTIVE 09/28/2023   Hepatitis C Lab Results  Component Value Date   HEPCAB NON-REACTIVE 09/28/2023   Hepatitis A Lab Results  Component Value Date   HAV NON-REACTIVE 09/28/2023   Lipids: Lab Results  Component Value Date   CHOL 136 09/28/2023   TRIG 143 09/28/2023   HDL 43 09/28/2023   CHOLHDL 3.2 09/28/2023   LDLCALC 70 09/28/2023    Current HIV Regimen: Biktarvy  Assessment: Phillip Barnett presents today to follow up for his HIV infection. Last saw [name], PA, back in April and was requesting detox from  multiple substances. His HIV RNA was undetectable at that visit; CD4 was 329. Fill history shows consistent filling every month of his Biktarvy.   Plan: ***  Adanely Reynoso L. Diella Gillingham, PharmD, BCIDP, AAHIVP, CPP Clinical Pharmacist Practitioner - Infectious Diseases Clinical Pharmacist Lead - Specialty Pharmacy [facility] for Infectious Disease

## 2024-02-28 ENCOUNTER — Ambulatory Visit: Admitting: Pharmacist

## 2024-02-28 ENCOUNTER — Other Ambulatory Visit: Payer: Self-pay

## 2024-02-28 ENCOUNTER — Other Ambulatory Visit (HOSPITAL_COMMUNITY)
Admission: RE | Admit: 2024-02-28 | Discharge: 2024-02-28 | Disposition: A | Discharge status: Home / Self Care | Source: Ambulatory Visit | Attending: Infectious Diseases | Admitting: Infectious Diseases

## 2024-02-28 DIAGNOSIS — Z21 Asymptomatic human immunodeficiency virus [HIV] infection status: Secondary | ICD-10-CM

## 2024-02-28 DIAGNOSIS — Z23 Encounter for immunization: Secondary | ICD-10-CM | POA: Diagnosis not present

## 2024-02-28 DIAGNOSIS — Z113 Encounter for screening for infections with a predominantly sexual mode of transmission: Secondary | ICD-10-CM

## 2024-02-28 MED ORDER — BICTEGRAVIR-EMTRICITAB-TENOFOV 50-200-25 MG PO TABS: 1.0000 | ORAL_TABLET | Freq: Every day | ORAL | 5 refills | Status: DC

## 2024-02-29 LAB — URINE CYTOLOGY ANCILLARY ONLY
Chlamydia: NEGATIVE
Comment: NEGATIVE
Comment: NORMAL
Neisseria Gonorrhea: NEGATIVE

## 2024-02-29 LAB — T-HELPER CELLS (CD4) COUNT (NOT AT ARMC)
CD4 % Helper T Cell: 37 % (ref 33–65)
CD4 T Cell Abs: 219 /uL — ABNORMAL LOW (ref 400–1790)

## 2024-03-01 LAB — CBC WITH DIFFERENTIAL/PLATELET
Absolute Lymphocytes: 604 {cells}/uL — ABNORMAL LOW (ref 850–3900)
Absolute Monocytes: 747 {cells}/uL (ref 200–950)
Basophils Absolute: 17 {cells}/uL (ref 0–200)
Basophils Relative: 0.3 %
Eosinophils Absolute: 91 {cells}/uL (ref 15–500)
Eosinophils Relative: 1.6 %
HCT: 45.6 % (ref 38.5–50.0)
Hemoglobin: 15.4 g/dL (ref 13.2–17.1)
MCH: 31.6 pg (ref 27.0–33.0)
MCHC: 33.8 g/dL (ref 32.0–36.0)
MCV: 93.6 fL (ref 80.0–100.0)
MPV: 10.5 fL (ref 7.5–12.5)
Monocytes Relative: 13.1 %
Neutro Abs: 4241 {cells}/uL (ref 1500–7800)
Neutrophils Relative %: 74.4 %
Platelets: 209 Thousand/uL (ref 140–400)
RBC: 4.87 Million/uL (ref 4.20–5.80)
RDW: 13.1 % (ref 11.0–15.0)
Total Lymphocyte: 10.6 %
WBC: 5.7 Thousand/uL (ref 3.8–10.8)

## 2024-03-01 LAB — LIPID PANEL
Cholesterol: 170 mg/dL (ref <200)
HDL: 36 mg/dL — ABNORMAL LOW (ref >=40)
LDL Cholesterol (Calc): 104 mg/dL — ABNORMAL HIGH
Non-HDL Cholesterol (Calc): 134 mg/dL — ABNORMAL HIGH (ref <130)
Total CHOL/HDL Ratio: 4.7 (calc) (ref <5.0)
Triglycerides: 178 mg/dL — ABNORMAL HIGH (ref <150)

## 2024-03-01 LAB — COMPREHENSIVE METABOLIC PANEL WITH GFR
AG Ratio: 1.2 (calc) (ref 1.0–2.5)
ALT: 14 U/L (ref 9–46)
AST: 17 U/L (ref 10–40)
Albumin: 4.2 g/dL (ref 3.6–5.1)
Alkaline phosphatase (APISO): 70 U/L (ref 36–130)
BUN/Creatinine Ratio: 10 (calc) (ref 6–22)
BUN: 14 mg/dL (ref 7–25)
CO2: 29 mmol/L (ref 20–32)
Calcium: 9.8 mg/dL (ref 8.6–10.3)
Chloride: 102 mmol/L (ref 98–110)
Creat: 1.42 mg/dL — ABNORMAL HIGH (ref 0.60–1.29)
Globulin: 3.6 g/dL (ref 1.9–3.7)
Glucose, Bld: 93 mg/dL (ref 65–99)
Potassium: 4.5 mmol/L (ref 3.5–5.3)
Sodium: 136 mmol/L (ref 135–146)
Total Bilirubin: 0.6 mg/dL (ref 0.2–1.2)
Total Protein: 7.8 g/dL (ref 6.1–8.1)
eGFR: 61 mL/min/1.73m2 (ref >=60)

## 2024-03-01 LAB — RPR: RPR Ser Ql: NONREACTIVE

## 2024-03-01 LAB — HIV-1 RNA QUANT-NO REFLEX-BLD
HIV 1 RNA Quant: <20 {copies}/mL — AB
HIV-1 RNA Quant, Log: <1.3 {Log_copies}/mL — AB

## 2024-03-09 ENCOUNTER — Telehealth: Payer: Self-pay | Admitting: Pharmacist

## 2024-03-09 NOTE — Telephone Encounter (Signed)
 Patient called yesterday requesting review of labs from recent appointment with Cassie. LVM today stating no concerns with labs and that he can call back with any further questions.  Alan Geralds, PharmD, CPP, BCIDP, AAHIVP Clinical Pharmacist Practitioner Infectious Diseases Clinical Pharmacist [facility] for Infectious Disease

## 2024-03-20 ENCOUNTER — Telehealth: Payer: Self-pay

## 2024-03-20 NOTE — Telephone Encounter (Signed)
 Patient called with difficulty getting Biktarvy from [employer].   He also has questions about his results from his last appointment. Relayed that pharmacist left VM for him stating that there were no concerns. Discussed that viral load undetectable and CD4 > 200.   Spoke with [facility], they will have his medication ready for pickup on Thursday. Called Jaion to notify him.  Deola Rewis, BSN, RN

## 2024-03-26 ENCOUNTER — Other Ambulatory Visit (HOSPITAL_COMMUNITY): Payer: Self-pay

## 2024-03-26 ENCOUNTER — Ambulatory Visit

## 2024-03-26 ENCOUNTER — Other Ambulatory Visit: Payer: Self-pay

## 2024-03-29 ENCOUNTER — Other Ambulatory Visit: Payer: Self-pay | Admitting: Pharmacist

## 2024-03-29 DIAGNOSIS — Z21 Asymptomatic human immunodeficiency virus [HIV] infection status: Secondary | ICD-10-CM

## 2024-03-29 MED ORDER — BICTEGRAVIR-EMTRICITAB-TENOFOV 50-200-25 MG PO TABS: 1.0000 | ORAL_TABLET | Freq: Every day | ORAL | Status: AC

## 2024-03-29 NOTE — Progress Notes (Signed)
 Medication Samples have been provided to the patient.  Drug name: Biktarvy         Strength: 50/200/25 mg       Qty: 14 tablets (2 bottles) LOT: CTGMDA   Exp.Date: 7/27  Samples requested by Diminique Veva, CMA.  Dosing instructions: Take one tablet by mouth once daily  The patient has been instructed regarding the correct time, dose, and frequency of taking this medication, including desired effects and most common side effects.   Alan Geralds, PharmD, CPP, BCIDP, AAHIVP Clinical Pharmacist Practitioner Infectious Diseases Clinical Pharmacist [facility] for Infectious Disease

## 2024-04-04 ENCOUNTER — Telehealth: Payer: Self-pay

## 2024-04-04 NOTE — Telephone Encounter (Signed)
 Patient called office requesting samples for Biktarvy. Last picked up 14 days of bik samples on 8/28. Stated he only has 4 pill left on hand. Pt denies misplacing medication. Will provide 7 day sample tomorrow when pt comes in.  Encouraged he reach out to Social Services case worker regarding insurance issues. Needs to inform them that he no longer has [employer] ins. Lorenda CHRISTELLA Code, RMA

## 2024-04-05 ENCOUNTER — Other Ambulatory Visit (HOSPITAL_COMMUNITY): Payer: Self-pay

## 2024-04-05 NOTE — Telephone Encounter (Signed)
 Patient reports he has fixed all information with [employer] and was advised by them that his insurance should be good in 72 hours Aleesha Ringstad ONEIDA Ligas, CMA

## 2024-04-12 NOTE — Telephone Encounter (Signed)
 Patient called stating he is still having issues with insurance update. Took last dose of Biktarvy sample today.  Pt has tried to speak with insurance to update them that [place] has been canceled. Change has not been updated yet.  Will provide samples today. Lorenda CHRISTELLA Code, RMA

## 2024-04-13 ENCOUNTER — Other Ambulatory Visit: Payer: Self-pay | Admitting: Pharmacist

## 2024-04-13 DIAGNOSIS — Z21 Asymptomatic human immunodeficiency virus [HIV] infection status: Secondary | ICD-10-CM

## 2024-04-13 MED ORDER — BICTEGRAVIR-EMTRICITAB-TENOFOV 50-200-25 MG PO TABS: 1.0000 | ORAL_TABLET | Freq: Every day | ORAL | Status: AC

## 2024-04-13 NOTE — Progress Notes (Signed)
 Medication Samples have been provided to the patient.  Drug name: Biktarvy        Strength: 50/200/25 mg       Qty: 7 tablets (1 bottles) LOT: CTGMDA   Exp.Date: 7/27  Samples requested by Arlon Bergamo, RN.  Dosing instructions: Take one tablet by mouth once daily  The patient has been instructed regarding the correct time, dose, and frequency of taking this medication, including desired effects and most common side effects.   Nicklas Barns, PharmD, CPP, BCIDP, AAHIVP Clinical Pharmacist Practitioner Infectious Diseases Clinical Pharmacist [facility] for Infectious Disease

## 2024-04-23 ENCOUNTER — Other Ambulatory Visit: Payer: Self-pay

## 2024-04-23 ENCOUNTER — Other Ambulatory Visit

## 2024-04-23 DIAGNOSIS — Z21 Asymptomatic human immunodeficiency virus [HIV] infection status: Secondary | ICD-10-CM

## 2024-04-23 DIAGNOSIS — Z113 Encounter for screening for infections with a predominantly sexual mode of transmission: Secondary | ICD-10-CM | POA: Diagnosis not present

## 2024-04-23 DIAGNOSIS — Z79899 Other long term (current) drug therapy: Secondary | ICD-10-CM

## 2024-04-24 LAB — T-HELPER CELL (CD4) - (RCID CLINIC ONLY)
CD4 % Helper T Cell: 43 % (ref 33–65)
CD4 T Cell Abs: 231 /uL — ABNORMAL LOW (ref 400–1790)

## 2024-04-25 LAB — CBC WITH DIFFERENTIAL/PLATELET
Absolute Lymphocytes: 539 {cells}/uL — ABNORMAL LOW (ref 850–3900)
Absolute Monocytes: 663 {cells}/uL (ref 200–950)
Basophils Absolute: 31 {cells}/uL (ref 0–200)
Basophils Relative: 0.5 %
Eosinophils Absolute: 81 {cells}/uL (ref 15–500)
Eosinophils Relative: 1.3 %
HCT: 44.2 % (ref 38.5–50.0)
Hemoglobin: 14.9 g/dL (ref 13.2–17.1)
MCH: 31.4 pg (ref 27.0–33.0)
MCHC: 33.7 g/dL (ref 32.0–36.0)
MCV: 93.1 fL (ref 80.0–100.0)
MPV: 10.7 fL (ref 7.5–12.5)
Monocytes Relative: 10.7 %
Neutro Abs: 4886 {cells}/uL (ref 1500–7800)
Neutrophils Relative %: 78.8 %
Platelets: 251 Thousand/uL (ref 140–400)
RBC: 4.75 Million/uL (ref 4.20–5.80)
RDW: 12.6 % (ref 11.0–15.0)
Total Lymphocyte: 8.7 %
WBC: 6.2 Thousand/uL (ref 3.8–10.8)

## 2024-04-25 LAB — LIPID PANEL
Cholesterol: 139 mg/dL (ref <200)
HDL: 33 mg/dL — ABNORMAL LOW (ref >=40)
LDL Cholesterol (Calc): 80 mg/dL
Non-HDL Cholesterol (Calc): 106 mg/dL (ref <130)
Total CHOL/HDL Ratio: 4.2 (calc) (ref <5.0)
Triglycerides: 159 mg/dL — ABNORMAL HIGH (ref <150)

## 2024-04-25 LAB — COMPLETE METABOLIC PANEL WITHOUT GFR
AG Ratio: 1.1 (calc) (ref 1.0–2.5)
ALT: 8 U/L — ABNORMAL LOW (ref 9–46)
AST: 14 U/L (ref 10–40)
Albumin: 4 g/dL (ref 3.6–5.1)
Alkaline phosphatase (APISO): 64 U/L (ref 36–130)
BUN: 11 mg/dL (ref 7–25)
CO2: 28 mmol/L (ref 20–32)
Calcium: 9.1 mg/dL (ref 8.6–10.3)
Chloride: 104 mmol/L (ref 98–110)
Creat: 1.28 mg/dL (ref 0.60–1.29)
Globulin: 3.6 g/dL (ref 1.9–3.7)
Glucose, Bld: 100 mg/dL — ABNORMAL HIGH (ref 65–99)
Potassium: 4.1 mmol/L (ref 3.5–5.3)
Sodium: 138 mmol/L (ref 135–146)
Total Bilirubin: 0.6 mg/dL (ref 0.2–1.2)
Total Protein: 7.6 g/dL (ref 6.1–8.1)

## 2024-04-25 LAB — HIV-1 RNA QUANT-NO REFLEX-BLD
HIV 1 RNA Quant: NOT DETECTED {copies}/mL
HIV-1 RNA Quant, Log: NOT DETECTED {Log_copies}/mL

## 2024-04-25 LAB — RPR: RPR Ser Ql: NONREACTIVE

## 2024-04-26 ENCOUNTER — Telehealth: Payer: Self-pay

## 2024-04-26 NOTE — Telephone Encounter (Signed)
 Notified by front desk that patient called and is at the dentist getting all of his teeth removed and wanted to make sure this was okay with Dr. Dea. Per Dr. Dea, this is not a problem as CD4 > 200.  Demond Shallenberger, BSN, RN

## 2024-04-30 ENCOUNTER — Other Ambulatory Visit: Payer: Self-pay

## 2024-04-30 ENCOUNTER — Ambulatory Visit: Admitting: Internal Medicine

## 2024-04-30 ENCOUNTER — Encounter: Payer: Self-pay | Admitting: Internal Medicine

## 2024-04-30 DIAGNOSIS — Z79899 Other long term (current) drug therapy: Secondary | ICD-10-CM | POA: Diagnosis not present

## 2024-04-30 DIAGNOSIS — Z21 Asymptomatic human immunodeficiency virus [HIV] infection status: Secondary | ICD-10-CM

## 2024-04-30 DIAGNOSIS — Z5900 Homelessness unspecified: Secondary | ICD-10-CM | POA: Diagnosis not present

## 2024-04-30 MED ORDER — BICTEGRAVIR-EMTRICITAB-TENOFOV 50-200-25 MG PO TABS: 1.0000 | ORAL_TABLET | Freq: Every day | ORAL | 5 refills | Status: DC

## 2024-04-30 NOTE — Patient Instructions (Addendum)
 PCP number to make appt:  [place] [phone]  Margarete [phone]

## 2024-04-30 NOTE — Progress Notes (Signed)
 [facility]     HPI: Phillip Barnett is a 48 y.o. male presents for HIV management on biktarvy.  A couple missed doses biktarvy Not sexually acitve sine last visit. Pt homeless in a tent. Teeth pulled last week Diagnosed 07/24/23 whilst donating plasma and received confirmatory testing by health department 07/29/23.  Previous negative HIV testing was in December the week prior while donating plasma, record review shows 01/11/23 as last negative HIV testing performed.  Risk factor: IVDU and heterosexual contact.  Last IVDU 07/17/23.  [facility]/drug/tobacco use: no/ivda hsitory/quit  Past Medical History:  Diagnosis Date   Alcohol abuse    Alcoholic pancreatitis    HIV (human immunodeficiency virus infection) (HCC)     Past Surgical History:  Procedure Laterality Date   ABDOMINAL SURGERY      No family history on file. Current Outpatient Medications on File Prior to Visit  Medication Sig Dispense Refill   bictegravir-emtricitabine-tenofovir AF (BIKTARVY) 50-200-25 MG TABS tablet Take 1 tablet by mouth daily. Try to take at the same time each day with or without food. 30 tablet 5   ondansetron  (ZOFRAN ) 4 MG tablet Take 1 tablet (4 mg total) by mouth every 8 (eight) hours as needed for nausea or vomiting. 20 tablet 0   triamcinolone  cream (KENALOG ) 0.1 % Apply 1 Application topically 2 (two) times daily. 30 g 0   No current facility-administered medications on file prior to visit.    Allergies  Allergen Reactions   Sulfa Antibiotics     Unknown      Lab Results HIV 1 RNA Quant (copies/mL)  Date Value  04/23/2024 NOT DETECTED  02/28/2024 <20 DETECTED (A)  11/28/2023 <20 DETECTED (A)   CD4 T Cell Abs (/uL)  Date Value  04/23/2024 231 (L)  02/28/2024 219 (L)   No results found for: HIV1GENOSEQ Lab Results  Component Value Date   WBC 6.2 04/23/2024   HGB 14.9 04/23/2024   HCT 44.2 04/23/2024   MCV 93.1 04/23/2024    PLT 251 04/23/2024    Lab Results  Component Value Date   CREATININE 1.28 04/23/2024   BUN 11 04/23/2024   NA 138 04/23/2024   K 4.1 04/23/2024   CL 104 04/23/2024   CO2 28 04/23/2024   Lab Results  Component Value Date   ALT 8 (L) 04/23/2024   AST 14 04/23/2024   ALKPHOS 57 06/09/2023   BILITOT 0.6 04/23/2024    Lab Results  Component Value Date   CHOL 139 04/23/2024   TRIG 159 (H) 04/23/2024   HDL 33 (L) 04/23/2024   LDLCALC 80 04/23/2024   Lab Results  Component Value Date   HAV NON-REACTIVE 09/28/2023   Lab Results  Component Value Date   HEPBSAG NON-REACTIVE 09/28/2023   HEPBSAB NON-REACTIVE 09/28/2023   No results found for: HCVAB Lab Results  Component Value Date   CHLAMYDIAWP Negative 02/28/2024   N Negative 02/28/2024   No results found for: GCPROBEAPT No results found for: QUANTGOLD  Assessment/Plan #HIV/Asymtomatic -[id number] , Vlnd 04/23/24, on biktarvy -f/u in 3 months  #homeless --referarl to thp  #Primary care provider   #Vaccination COVID Flu declined Monkeypox PCV 02/28/24 Meningitis 02/28/24 HepA nr 09/28/23, needs immunization HEpB 02/28/24*> 2nd odse Tdap 2023 Shingles  #Health maintenance -Quantiferon next visit -RPR 04/23/24 -HCVntr 09/28/23 -GC urine negativ e7/29/25 -Lipid The 10-year ASCVD risk score (Arnett DK, et al., 2019) is: 8.2%  Values used to calculate the score:     Age: 23 years     Clincally relevant sex: Male     Is Non-Hispanic African American: Yes     Diabetic: No     Tobacco smoker: Yes     Systolic Blood Pressure: 127 mmHg     Is BP treated: No     HDL Cholesterol: 33 mg/dL     Total Cholesterol: 139 mg/dL  -Dysplasia screen M: denies msm -Colonoscopy    Loney Stank, MD [facility]  I have personally spent 45 minutes involved in face-to-face and non-face-to-face activities for this patient on the day of the visit. Professional time  spent includes the following activities: Preparing to see the patient (review of tests), Obtaining and/or reviewing separately obtained history (admission/discharge record), Performing a medically appropriate examination and/or evaluation , Ordering medications/tests/procedures, referring and communicating with other health care professionals, Documenting clinical information in the EMR, Independently interpreting results (not separately reported), Communicating results to the patient/family/caregiver, Counseling and educating the patient/family/caregiver and Care coordination (not separately reported).

## 2024-05-01 NOTE — Progress Notes (Signed)
 Referral for [facility] faxed with recent lab results. Received confirmation fax was successful.  Lorenda CHRISTELLA Code, RMA

## 2024-07-12 ENCOUNTER — Ambulatory Visit: Admitting: Internal Medicine

## 2024-07-12 NOTE — Progress Notes (Deleted)
 [facility]     HPI: Phillip Barnett is a 48 y.o. male male presents for HIV management on biktarvy.  A couple missed doses biktarvy Not sexually acitve sine last visit. Pt homeless in a tent. Teeth pulled last week Diagnosed 07/24/23 whilst donating plasma and received confirmatory testing by health department 07/29/23.  Previous negative HIV testing was in December the week prior while donating plasma, record review shows 01/11/23 as last negative HIV testing performed.  Risk factor: IVDU and heterosexual contact.  Last IVDU 07/17/23.  Has [facility]/drug/tobacco use: no/ivda hsitory/quit  Past Medical History:  Diagnosis Date   Alcohol abuse    Alcoholic pancreatitis    HIV (human immunodeficiency virus infection) (HCC)     Past Surgical History:  Procedure Laterality Date   ABDOMINAL SURGERY      No family history on file. Medications Ordered Prior to Encounter[1]  Allergies[2]    Lab Results HIV 1 RNA Quant (copies/mL)  Date Value  04/23/2024 NOT DETECTED  02/28/2024 <20 DETECTED (A)  11/28/2023 <20 DETECTED (A)   CD4 T Cell Abs (/uL)  Date Value  04/23/2024 231 (L)  02/28/2024 219 (L)   No results found for: HIV1GENOSEQ Lab Results  Component Value Date   WBC 6.2 04/23/2024   HGB 14.9 04/23/2024   HCT 44.2 04/23/2024   MCV 93.1 04/23/2024   PLT 251 04/23/2024    Lab Results  Component Value Date   CREATININE 1.28 04/23/2024   BUN 11 04/23/2024   NA 138 04/23/2024   K 4.1 04/23/2024   CL 104 04/23/2024   CO2 28 04/23/2024   Lab Results  Component Value Date   ALT 8 (L) 04/23/2024   AST 14 04/23/2024   ALKPHOS 57 06/09/2023   BILITOT 0.6 04/23/2024    Lab Results  Component Value Date   CHOL 139 04/23/2024   TRIG 159 (H) 04/23/2024   HDL 33 (L) 04/23/2024   LDLCALC 80 04/23/2024   Lab Results  Component Value Date   HAV NON-REACTIVE 09/28/2023   Lab Results  Component Value Date   HEPBSAG  NON-REACTIVE 09/28/2023   HEPBSAB NON-REACTIVE 09/28/2023   No results found for: HCVAB Lab Results  Component Value Date   CHLAMYDIAWP Negative 02/28/2024   N Negative 02/28/2024   No results found for: GCPROBEAPT No results found for: QUANTGOLD  Assessment/Plan #HIV/Asymtomatic -RI5768 , Vlnd 04/23/24, on biktarvy -f/u in 3 months   #homeless --referarl to thp   #Primary care provider     #Vaccination COVID Flu declined Monkeypox PCV 02/28/24 Meningitis 02/28/24 HepA nr 09/28/23, needs immunization HEpB 02/28/24*> 2nd odse Tdap 2023 Shingles   #Health maintenance -Quantiferon next visit -RPR 04/23/24 -HCVn r 09/28/23 -GC urine negativ e7/29/25 -Lipid The 10-year ASCVD risk score (Arnett DK, et al., 2019) is: 8.2%   Values used to calculate the score:     Age: 71 years     Clincally relevant sex: Male     Is Non-Hispanic African American: Yes     Diabetic: No     Tobacco smoker: Yes     Systolic Blood Pressure: 127 mmHg     Is BP treated: No     HDL Cholesterol: 33 mg/dL     Total Cholesterol: 139 mg/dL   -Dysplasia screen M: denies msm -Colonoscopy    Loney Stank, MD [facility]     [1]  Current Outpatient Medications on File Prior to Visit  Medication Sig Dispense Refill   amoxicillin (AMOXIL) 500 MG capsule Take 500 mg by mouth every 8 (eight) hours.     bictegravir-emtricitabine-tenofovir AF (BIKTARVY) 50-200-25 MG TABS tablet Take 1 tablet by mouth daily. Try to take at the same time each day with or without food. 30 tablet 5   ondansetron  (ZOFRAN ) 4 MG tablet Take 1 tablet (4 mg total) by mouth every 8 (eight) hours as needed for nausea or vomiting. 20 tablet 0   triamcinolone  cream (KENALOG ) 0.1 % Apply 1 Application topically 2 (two) times daily. (Patient not taking: Reported on 04/30/2024) 30 g 0   No current facility-administered medications on file prior to visit.  [2]   Allergies Allergen Reactions   Sulfa Antibiotics     Unknown

## 2024-07-31 ENCOUNTER — Other Ambulatory Visit: Payer: Self-pay

## 2024-07-31 ENCOUNTER — Encounter: Payer: Self-pay | Admitting: Family

## 2024-07-31 ENCOUNTER — Ambulatory Visit (INDEPENDENT_AMBULATORY_CARE_PROVIDER_SITE_OTHER): Admitting: Family

## 2024-07-31 VITALS — BP 124/89 | HR 66 | Temp 97.9°F | Ht 74.0 in | Wt 185.0 lb

## 2024-07-31 DIAGNOSIS — Z21 Asymptomatic human immunodeficiency virus [HIV] infection status: Secondary | ICD-10-CM

## 2024-07-31 DIAGNOSIS — Z1211 Encounter for screening for malignant neoplasm of colon: Secondary | ICD-10-CM | POA: Diagnosis not present

## 2024-07-31 DIAGNOSIS — Z9189 Other specified personal risk factors, not elsewhere classified: Secondary | ICD-10-CM | POA: Diagnosis not present

## 2024-07-31 DIAGNOSIS — Z113 Encounter for screening for infections with a predominantly sexual mode of transmission: Secondary | ICD-10-CM

## 2024-07-31 DIAGNOSIS — Z0000 Encounter for general adult medical examination without abnormal findings: Secondary | ICD-10-CM | POA: Insufficient documentation

## 2024-07-31 MED ORDER — BICTEGRAVIR-EMTRICITAB-TENOFOV 50-200-25 MG PO TABS: 1.0000 | ORAL_TABLET | Freq: Every day | ORAL | 5 refills | Status: AC

## 2024-07-31 NOTE — Assessment & Plan Note (Signed)
 Phillip Barnett continues to have well-controlled virus with good adherence and tolerance to [employer].  Reviewed previous lab work and discussed plan of care and U equals U.  No problems obtaining medication from the pharmacy and covered by Medicaid.  Social determinants of health reviewed and awaiting connection with case management.  Check blood work.  Continue current dose of Biktarvy.  Plan for follow-up in 3 months or sooner if needed with lab work on the same day.

## 2024-07-31 NOTE — Progress Notes (Signed)
 "   Brief Narrative   Patient ID: Phillip Barnett, male    DOB: 1975/12/30, 48 y.o.   MRN: [id number]  Phillip Barnett is a 48 y/o AA male diagnosed with HIV disease in December 2024 with risk factor of injection drug use and heterosexual contact. Initial viral load was 104,000 with CD4 count 824. Entered care at [facility] Stage 1. Genotype with no significant medication resistant mutations. No history of opportunistic infection. ART experienced with Biktarvy.   Subjective:   Chief Complaint  Patient presents with   Follow-up    B20    HPI:  Phillip Barnett is a 48 y.o. male with HIV disease last seen on 04/30/24 by Dr. Dennise with well controlled virus and good adherence and tolerance to Biktarvy. Viral load was undetectable and CD4 count was 231. Kidney function, liver function and electrolytes were normal. Lipid profile with triglycerides 159, LDL 80, and HDL 33. Here today for follow up.  Phillip Barnett has been doing okay since his last office visit and continues to take [facility] as prescribed with no adverse side effects or problems obtaining medication from the pharmacy.  Covered by Medicaid.  Has questions about available financial assistance and has not heard back from his case management team.  Transportation is via friend and has access to food. Remains sober from injection drug use and continues to drink alcohol from time to time. Healthcare maintenance reviewed. Condoms and site specific STD testing offered as he is sexually active. Routine dental care up to date.  Denies fevers, chills, night sweats, headaches, changes in vision, neck pain/stiffness, nausea, diarrhea, vomiting, lesions or rashes.  Lab Results  Component Value Date   CD4TCELL 43 04/23/2024   CD4TABS 231 (L) 04/23/2024   Lab Results  Component Value Date   HIV1RNAQUANT NOT DETECTED 04/23/2024     Allergies[1]    Outpatient Medications Prior to Visit  Medication Sig Dispense Refill   bictegravir-emtricitabine-tenofovir AF  (BIKTARVY) 50-200-25 MG TABS tablet Take 1 tablet by mouth daily. Try to take at the same time each day with or without food. 30 tablet 5   amoxicillin (AMOXIL) 500 MG capsule Take 500 mg by mouth every 8 (eight) hours. (Patient not taking: Reported on 07/31/2024)     ondansetron  (ZOFRAN ) 4 MG tablet Take 1 tablet (4 mg total) by mouth every 8 (eight) hours as needed for nausea or vomiting. (Patient not taking: Reported on 07/31/2024) 20 tablet 0   triamcinolone  cream (KENALOG ) 0.1 % Apply 1 Application topically 2 (two) times daily. (Patient not taking: Reported on 07/31/2024) 30 g 0   No facility-administered medications prior to visit.     Past Medical History:  Diagnosis Date   Alcohol abuse    Alcoholic pancreatitis    HIV (human immunodeficiency virus infection) (HCC)      Past Surgical History:  Procedure Laterality Date   ABDOMINAL SURGERY       Review of Systems  Constitutional:  Negative for appetite change, chills, fatigue, fever and unexpected weight change.  Eyes:  Negative for visual disturbance.  Respiratory:  Negative for cough, chest tightness, shortness of breath and wheezing.   Cardiovascular:  Negative for chest pain and leg swelling.  Gastrointestinal:  Negative for abdominal pain, constipation, diarrhea, nausea and vomiting.  Genitourinary:  Negative for dysuria, flank pain, frequency, genital sores, hematuria and urgency.  Skin:  Negative for rash.  Allergic/Immunologic: Negative for immunocompromised state.  Neurological:  Negative for dizziness and headaches.  Objective:   BP 124/89   Pulse 66   Temp 97.9 F (36.6 C) (Temporal)   Ht 6' 2 (1.88 m)   Wt 185 lb (83.9 kg)   SpO2 97%   BMI 23.75 kg/m  Nursing note and vital signs reviewed.  Physical Exam Constitutional:      General: He is not in acute distress.    Appearance: He is well-developed.  Eyes:     Conjunctiva/sclera: Conjunctivae normal.  Cardiovascular:     Rate and Rhythm:  Normal rate and regular rhythm.     Heart sounds: Normal heart sounds. No murmur heard.    No friction rub. No gallop.  Pulmonary:     Effort: Pulmonary effort is normal. No respiratory distress.     Breath sounds: Normal breath sounds. No wheezing or rales.  Chest:     Chest wall: No tenderness.  Abdominal:     General: Bowel sounds are normal.     Palpations: Abdomen is soft.     Tenderness: There is no abdominal tenderness.  Musculoskeletal:     Cervical back: Neck supple.  Lymphadenopathy:     Cervical: No cervical adenopathy.  Skin:    General: Skin is warm and dry.     Findings: No rash.  Neurological:     Mental Status: He is alert and oriented to person, place, and time.          11/28/2023   12:53 PM 09/28/2023    1:24 PM  Depression screen PHQ 2/9  Decreased Interest 2 0  Down, Depressed, Hopeless 2 0  PHQ - 2 Score 4 0  Altered sleeping 2   Tired, decreased energy 2   Change in appetite 2   Feeling bad or failure about yourself  2   Trouble concentrating 2   Moving slowly or fidgety/restless 2   Suicidal thoughts 2   PHQ-9 Score 18    Difficult doing work/chores Very difficult      Data saved with a previous flowsheet row definition         No data to display           The 10-year ASCVD risk score (Arnett DK, et al., 2019) is: 7.9%   Values used to calculate the score:     Age: 62 years     Clinically relevant sex: Male     Is Non-Hispanic African American: Yes     Diabetic: No     Tobacco smoker: Yes     Systolic Blood Pressure: 124 mmHg     Is BP treated: No     HDL Cholesterol: 33 mg/dL     Total Cholesterol: 139 mg/dL      Assessment & Plan:    Patient Active Problem List   Diagnosis Date Noted   Healthcare maintenance 07/31/2024   Polysubstance abuse (HCC) 11/28/2023   Asymptomatic HIV infection (HCC) 09/28/2023   Adenopathy 07/25/2018   Chronic abdominal pain 07/25/2018   Chronic pancreatitis (HCC) 07/05/2018     Problem  List Items Addressed This Visit       Other   Asymptomatic HIV infection [facility])   Phillip Barnett continues to have well-controlled virus with good adherence and tolerance to Biktarvy.  Reviewed previous lab work and discussed plan of care and U equals U.  No problems obtaining medication from the pharmacy and covered by Medicaid.  Social determinants of health reviewed and awaiting connection with case management.  Check blood work.  Continue current dose  of Biktarvy.  Plan for follow-up in 3 months or sooner if needed with lab work on the same day.      Relevant Medications   bictegravir-emtricitabine-tenofovir AF (BIKTARVY) 50-200-25 MG TABS tablet   Other Relevant Orders   Comprehensive metabolic panel with GFR   HIV-1 RNA quant-no reflex-bld   T-helper cell (CD4)- ([facility] clinic only)   Healthcare maintenance   Discussed importance of safe sexual practice and condom use. Condoms and site specific STD testing offered.  Vaccinations reviewed and declined following counseling.  Dental care up to date.  Referral placed to GI for colonoscopy for colon cancer screening.       Other Visit Diagnoses       Encounter for screening for malignant neoplasm of colon    -  Primary   Relevant Orders   Ambulatory referral to Gastroenterology     Screening for STDs (sexually transmitted diseases)       Relevant Orders   RPR W/RFLX TO RPR TITER, TREPONEMAL AB, SCREEN AND DIAGNOSIS        I have discontinued Phillip Barnett's ondansetron , triamcinolone  cream, and amoxicillin. I am also having him maintain his bictegravir-emtricitabine-tenofovir AF.   Meds ordered this encounter  Medications   bictegravir-emtricitabine-tenofovir AF (BIKTARVY) 50-200-25 MG TABS tablet    Sig: Take 1 tablet by mouth daily. Try to take at the same time each day with or without food.    Dispense:  30 tablet    Refill:  5    Supervising Provider:   SNIDER, CYNTHIA [phone]    Prescription Type::   Renewal     Follow-up:  Return in about 3 months (around 10/29/2024). or sooner if needed.    Cathlyn July, MSN, FNP-C Nurse Practitioner [facility] for Infectious Disease [facility] Main number: [phone]      [1]  Allergies Allergen Reactions   Sulfa Antibiotics     Unknown   "

## 2024-07-31 NOTE — Patient Instructions (Addendum)
Nice to see you.  We will check your lab work today.  Continue to take your medication daily as prescribed.  Refills have been sent to the pharmacy.  Plan for follow up with Dr. Candiss Norse in 3 months or sooner if needed with lab work on the same day.  Have a great day and stay safe!

## 2024-07-31 NOTE — Assessment & Plan Note (Signed)
 Phillip Barnett has an ASCVD risk score of 7.9% and may benefit from addition of statin medication to reduce risk of cardiovascular disease and HIV associated inflammation. Will discuss at next office visit.

## 2024-07-31 NOTE — Assessment & Plan Note (Signed)
 Discussed importance of safe sexual practice and condom use. Condoms and site specific STD testing offered.  Vaccinations reviewed and declined following counseling.  Dental care up to date.  Referral placed to GI for colonoscopy for colon cancer screening.

## 2024-08-01 LAB — T-HELPER CELL (CD4) - (RCID CLINIC ONLY)
CD4 % Helper T Cell: 37 % (ref 33–65)
CD4 T Cell Abs: 192 /uL — ABNORMAL LOW (ref 400–1790)

## 2024-08-02 LAB — COMPREHENSIVE METABOLIC PANEL WITH GFR
AG Ratio: 1.1 (calc) (ref 1.0–2.5)
ALT: 12 U/L (ref 9–46)
AST: 17 U/L (ref 10–40)
Albumin: 4 g/dL (ref 3.6–5.1)
Alkaline phosphatase (APISO): 86 U/L (ref 36–130)
BUN: 10 mg/dL (ref 7–25)
CO2: 31 mmol/L (ref 20–32)
Calcium: 9 mg/dL (ref 8.6–10.3)
Chloride: 102 mmol/L (ref 98–110)
Creat: 1.29 mg/dL (ref 0.60–1.29)
Globulin: 3.5 g/dL (ref 1.9–3.7)
Glucose, Bld: 114 mg/dL — ABNORMAL HIGH (ref 65–99)
Potassium: 3.9 mmol/L (ref 3.5–5.3)
Sodium: 138 mmol/L (ref 135–146)
Total Bilirubin: 0.9 mg/dL (ref 0.2–1.2)
Total Protein: 7.5 g/dL (ref 6.1–8.1)
eGFR: 68 mL/min/1.73m2

## 2024-08-02 LAB — SYPHILIS: RPR W/REFLEX TO RPR TITER AND TREPONEMAL ANTIBODIES, TRADITIONAL SCREENING AND DIAGNOSIS ALGORITHM: RPR Ser Ql: NONREACTIVE

## 2024-08-02 LAB — HIV-1 RNA QUANT-NO REFLEX-BLD
HIV 1 RNA Quant: 21 {copies}/mL — ABNORMAL HIGH
HIV-1 RNA Quant, Log: 1.32 {Log_copies}/mL — ABNORMAL HIGH

## 2024-08-03 ENCOUNTER — Ambulatory Visit: Payer: Self-pay | Admitting: Family

## 2024-08-06 NOTE — Progress Notes (Signed)
 Patient called back- reviewed results with him. No questions at this time.

## 2024-08-06 NOTE — Progress Notes (Signed)
"  Call can not be completed at this time  "

## 2024-10-22 ENCOUNTER — Ambulatory Visit: Payer: Self-pay | Admitting: Family
